# Patient Record
Sex: Male | Born: 1985 | Race: White | Hispanic: No | Smoking: Current every day smoker
Health system: Southern US, Community
[De-identification: ages and names within clinical notes are randomized; demographics above are authoritative.]

## PROBLEM LIST (undated history)

## (undated) DIAGNOSIS — K219 Gastro-esophageal reflux disease without esophagitis: Secondary | ICD-10-CM

---

## 2007-03-18 ENCOUNTER — Emergency Department (HOSPITAL_COMMUNITY): Admission: EM | Admit: 2007-03-18 | Discharge: 2007-03-18 | Payer: Self-pay | Admitting: Emergency Medicine

## 2008-01-15 ENCOUNTER — Emergency Department (HOSPITAL_COMMUNITY): Admission: EM | Admit: 2008-01-15 | Discharge: 2008-01-15 | Payer: Self-pay | Admitting: Emergency Medicine

## 2009-06-28 ENCOUNTER — Emergency Department (HOSPITAL_COMMUNITY): Admission: EM | Admit: 2009-06-28 | Discharge: 2009-06-28 | Payer: Self-pay | Admitting: Emergency Medicine

## 2013-11-12 ENCOUNTER — Emergency Department (HOSPITAL_COMMUNITY)
Admission: EM | Admit: 2013-11-12 | Discharge: 2013-11-12 | Disposition: A | Discharge status: Home / Self Care | Payer: Self-pay | Source: Home / Self Care | Attending: Family Medicine | Admitting: Family Medicine

## 2013-11-12 ENCOUNTER — Encounter (HOSPITAL_COMMUNITY): Payer: Self-pay | Admitting: Emergency Medicine

## 2013-11-12 DIAGNOSIS — Y92410 Unspecified street and highway as the place of occurrence of the external cause: Secondary | ICD-10-CM

## 2013-11-12 DIAGNOSIS — S39012A Strain of muscle, fascia and tendon of lower back, initial encounter: Secondary | ICD-10-CM

## 2013-11-12 DIAGNOSIS — S335XXA Sprain of ligaments of lumbar spine, initial encounter: Secondary | ICD-10-CM

## 2013-11-12 LAB — POCT URINALYSIS DIP (DEVICE)
Bilirubin Urine: NEGATIVE
Glucose, UA: NEGATIVE mg/dL
Hgb urine dipstick: NEGATIVE
KETONES UR: NEGATIVE mg/dL
LEUKOCYTES UA: NEGATIVE
Nitrite: NEGATIVE
PH: 6 (ref 5.0–8.0)
Protein, ur: NEGATIVE mg/dL
Specific Gravity, Urine: 1.02 (ref 1.005–1.030)
UROBILINOGEN UA: 0.2 mg/dL (ref 0.0–1.0)

## 2013-11-12 MED ORDER — IBUPROFEN 800 MG PO TABS
ORAL_TABLET | ORAL | Status: AC
  Filled 2013-11-12: qty 1

## 2013-11-12 MED ORDER — IBUPROFEN 800 MG PO TABS
800.0000 mg | ORAL_TABLET | Freq: Once | ORAL | Status: AC
  Administered 2013-11-12: 800 mg via ORAL

## 2013-11-12 MED ORDER — NAPROXEN 500 MG PO TABS: 500.0000 mg | ORAL_TABLET | Freq: Two times a day (BID) | ORAL | Status: DC
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
## 2013-11-12 NOTE — Discharge Instructions (Signed)
Your urine studies were normal. Please use medication as prescribed for pain and limit heavy lifting for the next 3-4 days. You can expect to be sore for the net 3-5 days. If symptoms become suddenly worse or severe, please return for re-evaluation.   Back Pain, Adult Low back pain is very common. About 1 in 5 people have back pain.The cause of low back pain is rarely dangerous. The pain often gets better over time.About half of people with a sudden onset of back pain feel better in just 2 weeks. About 8 in 10 people feel better by 6 weeks.  CAUSES Some common causes of back pain include:  Strain of the muscles or ligaments supporting the spine.  Wear and tear (degeneration) of the spinal discs.  Arthritis.  Direct injury to the back. DIAGNOSIS Most of the time, the direct cause of low back pain is not known.However, back pain can be treated effectively even when the exact cause of the pain is unknown.Answering your caregiver's questions about your overall health and symptoms is one of the most accurate ways to make sure the cause of your pain is not dangerous. If your caregiver needs more information, he or she may order lab work or imaging tests (X-rays or MRIs).However, even if imaging tests show changes in your back, this usually does not require surgery. HOME CARE INSTRUCTIONS For many people, back pain returns.Since low back pain is rarely dangerous, it is often a condition that people can learn to [facility] their own.   Remain active. It is stressful on the back to sit or stand in one place. Do not sit, drive, or stand in one place for more than 30 minutes at a time. Take short walks on level surfaces as soon as pain allows.Try to increase the length of time you walk each day.  Do not stay in bed.Resting more than 1 or 2 days can delay your recovery.  Do not avoid exercise or work.Your body is made to move.It is not dangerous to be active, even though your back may hurt.Your  back will likely heal faster if you return to being active before your pain is gone.  Pay attention to your body when you bend and lift. Many people have less discomfortwhen lifting if they bend their knees, keep the load close to their bodies,and avoid twisting. Often, the most comfortable positions are those that put less stress on your recovering back.  Find a comfortable position to sleep. Use a firm mattress and lie on your side with your knees slightly bent. If you lie on your back, put a pillow under your knees.  Only take over-the-counter or prescription medicines as directed by your caregiver. Over-the-counter medicines to reduce pain and inflammation are often the most helpful.Your caregiver may prescribe muscle relaxant drugs.These medicines help dull your pain so you can more quickly return to your normal activities and healthy exercise.  Put ice on the injured area.  Put ice in a plastic bag.  Place a towel between your skin and the bag.  Leave the ice on for 15-20 minutes, 03-04 times a day for the first 2 to 3 days. After that, ice and heat may be alternated to reduce pain and spasms.  Ask your caregiver about trying back exercises and gentle massage. This may be of some benefit.  Avoid feeling anxious or stressed.Stress increases muscle tension and can worsen back pain.It is important to recognize when you are anxious or stressed and learn ways to manage  it.Exercise is a great option. SEEK MEDICAL CARE IF:  You have pain that is not relieved with rest or medicine.  You have pain that does not improve in 1 week.  You have new symptoms.  You are generally not feeling well. SEEK IMMEDIATE MEDICAL CARE IF:   You have pain that radiates from your back into your legs.  You develop new bowel or bladder control problems.  You have unusual weakness or numbness in your arms or legs.  You develop nausea or vomiting.  You develop abdominal pain.  You feel  faint. Document Released: 05/30/2005 Document Revised: 11/29/2011 Document Reviewed: 10/18/2010 [facility] Patient Information 2014 [place], [place].  Lumbosacral Strain Lumbosacral strain is a strain of any of the parts that make up your lumbosacral vertebrae. Your lumbosacral vertebrae are the bones that make up the lower third of your backbone. Your lumbosacral vertebrae are held together by muscles and tough, fibrous tissue (ligaments).  CAUSES  A sudden blow to your back can cause lumbosacral strain. Also, anything that causes an excessive stretch of the muscles in the low back can cause this strain. This is typically seen when people exert themselves strenuously, fall, lift heavy objects, bend, or crouch repeatedly. RISK FACTORS  Physically demanding work.  Participation in pushing or pulling sports or sports that require sudden twist of the back (tennis, golf, baseball).  Weight lifting.  Excessive lower back curvature.  Forward-tilted pelvis.  Weak back or abdominal muscles or both.  Tight hamstrings. SIGNS AND SYMPTOMS  Lumbosacral strain may cause pain in the area of your injury or pain that moves (radiates) down your leg.  DIAGNOSIS Your health care provider can often diagnose lumbosacral strain through a physical exam. In some cases, you may need tests such as X-ray exams.  TREATMENT  Treatment for your lower back injury depends on many factors that your clinician will have to evaluate. However, most treatment will include the use of anti-inflammatory medicines. HOME CARE INSTRUCTIONS   Avoid hard physical activities (tennis, racquetball, waterskiing) if you are not in proper physical condition for it. This may aggravate or create problems.  If you have a back problem, avoid sports requiring sudden body movements. Swimming and walking are generally safer activities.  Maintain good posture.  Maintain a healthy weight.  For acute conditions, you may put ice on the  injured area.  Put ice in a plastic bag.  Place a towel between your skin and the bag.  Leave the ice on for 20 minutes, 2 3 times a day.  When the low back starts healing, stretching and strengthening exercises may be recommended. SEEK MEDICAL CARE IF:  Your back pain is getting worse.  You experience severe back pain not relieved with medicines. SEEK IMMEDIATE MEDICAL CARE IF:   You have numbness, tingling, weakness, or problems with the use of your arms or legs.  There is a change in bowel or bladder control.  You have increasing pain in any area of the body, including your belly (abdomen).  You notice shortness of breath, dizziness, or feel faint.  You feel sick to your stomach (nauseous), are throwing up (vomiting), or become sweaty.  You notice discoloration of your toes or legs, or your feet get very cold. MAKE SURE YOU:   Understand these instructions.  Will watch your condition.  Will get help right away if you are not doing well or get worse. Document Released: 03/09/2005 Document Revised: 03/20/2013 Document Reviewed: 01/16/2013 [facility] Patient Information 2014 [place], [place].  Motor Vehicle Collision  It is common to have multiple bruises and sore muscles after a motor vehicle collision (MVC). These tend to feel worse for the first 24 hours. You may have the most stiffness and soreness over the first several hours. You may also feel worse when you wake up the first morning after your collision. After this point, you will usually begin to improve with each day. The speed of improvement often depends on the severity of the collision, the number of injuries, and the location and nature of these injuries. HOME CARE INSTRUCTIONS   Put ice on the injured area.  Put ice in a plastic bag.  Place a towel between your skin and the bag.  Leave the ice on for 15-20 minutes, 03-04 times a day.  Drink enough fluids to keep your urine clear or pale yellow. Do not drink  alcohol.  Take a warm shower or bath once or twice a day. This will increase blood flow to sore muscles.  You may return to activities as directed by your caregiver. Be careful when lifting, as this may aggravate neck or back pain.  Only take over-the-counter or prescription medicines for pain, discomfort, or fever as directed by your caregiver. Do not use aspirin. This may increase bruising and bleeding. SEEK IMMEDIATE MEDICAL CARE IF:  You have numbness, tingling, or weakness in the arms or legs.  You develop severe headaches not relieved with medicine.  You have severe neck pain, especially tenderness in the middle of the back of your neck.  You have changes in bowel or bladder control.  There is increasing pain in any area of the body.  You have shortness of breath, lightheadedness, dizziness, or fainting.  You have chest pain.  You feel sick to your stomach (nauseous), throw up (vomit), or sweat.  You have increasing abdominal discomfort.  There is blood in your urine, stool, or vomit.  You have pain in your shoulder (shoulder strap areas).  You feel your symptoms are getting worse. MAKE SURE YOU:   Understand these instructions.  Will watch your condition.  Will get help right away if you are not doing well or get worse. Document Released: 05/30/2005 Document Revised: 08/22/2011 Document Reviewed: 10/27/2010 [facility] Patient Information 2014 [place], [place].  Muscle Strain A muscle strain is an injury that occurs when a muscle is stretched beyond its normal length. Usually a small number of muscle fibers are torn when this happens. Muscle strain is rated in degrees. First-degree strains have the least amount of muscle fiber tearing and pain. Second-degree and third-degree strains have increasingly more tearing and pain.  Usually, recovery from muscle strain takes 1 2 weeks. Complete healing takes 5 6 weeks.  CAUSES  Muscle strain happens when a sudden, violent  force placed on a muscle stretches it too far. This may occur with lifting, sports, or a fall.  RISK FACTORS Muscle strain is especially common in athletes.  SIGNS AND SYMPTOMS At the site of the muscle strain, there may be:  Pain.  Bruising.  Swelling.  Difficulty using the muscle due to pain or lack of normal function. DIAGNOSIS  Your health care provider will perform a physical exam and ask about your medical history. TREATMENT  Often, the best treatment for a muscle strain is resting, icing, and applying cold compresses to the injured area.  HOME CARE INSTRUCTIONS   Use the PRICE method of treatment to promote muscle healing during the first 2 3 days after your injury.  The PRICE method involves:  Protecting the muscle from being injured again.  Restricting your activity and resting the injured body part.  Icing your injury. To do this, put ice in a plastic bag. Place a towel between your skin and the bag. Then, apply the ice and leave it on from 15 20 minutes each hour. After the third day, switch to moist heat packs.  Apply compression to the injured area with a splint or elastic bandage. Be careful not to wrap it too tightly. This may interfere with blood circulation or increase swelling.  Elevate the injured body part above the level of your heart as often as you can.  Only take over-the-counter or prescription medicines for pain, discomfort, or fever as directed by your health care provider.  Warming up prior to exercise helps to prevent future muscle strains. SEEK MEDICAL CARE IF:   You have increasing pain or swelling in the injured area.  You have numbness, tingling, or a significant loss of strength in the injured area. MAKE SURE YOU:   Understand these instructions.  Will watch your condition.  Will get help right away if you are not doing well or get worse. Document Released: 05/30/2005 Document Revised: 03/20/2013 Document Reviewed: 12/27/2012 [facility]  Patient Information 2014 [place], [place].

## 2013-11-12 NOTE — ED Provider Notes (Signed)
Medical screening examination/treatment/procedure(s) were performed by resident physician or non-physician practitioner and as supervising physician I was immediately available for consultation/collaboration.   Barkley Bruns MD.   Linna Hoff, MD 11/12/13 1600
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
## 2014-07-26 ENCOUNTER — Emergency Department (HOSPITAL_COMMUNITY)
Admission: EM | Admit: 2014-07-26 | Discharge: 2014-07-27 | Disposition: A | Discharge status: Home / Self Care | Payer: Self-pay | Attending: Emergency Medicine | Admitting: Emergency Medicine

## 2014-07-26 ENCOUNTER — Encounter (HOSPITAL_COMMUNITY): Payer: Self-pay | Admitting: Emergency Medicine

## 2014-07-26 DIAGNOSIS — Z720 Tobacco use: Secondary | ICD-10-CM | POA: Insufficient documentation

## 2014-07-26 DIAGNOSIS — R51 Headache: Secondary | ICD-10-CM | POA: Insufficient documentation

## 2014-07-26 DIAGNOSIS — M791 Myalgia: Secondary | ICD-10-CM | POA: Insufficient documentation

## 2014-07-26 DIAGNOSIS — J029 Acute pharyngitis, unspecified: Secondary | ICD-10-CM | POA: Insufficient documentation

## 2014-07-26 DIAGNOSIS — R519 Headache, unspecified: Secondary | ICD-10-CM

## 2014-07-26 NOTE — ED Notes (Signed)
Pt. reports persistent headache onset last week unrelieved by OTC pain medications , denies head injury , no emesis or fever .

## 2014-07-26 NOTE — ED Provider Notes (Addendum)
CSN: [id number]     Arrival date & time 07/26/14  2148 History   First MD Initiated Contact with Patient 07/26/14 2352     Chief Complaint  Patient presents with  . Headache   Patient is a 29 y.o. male presenting with headaches. The history is provided by the patient. No language interpreter was used.  Headache Associated symptoms: myalgias and sore throat   Associated symptoms: no fever, no nausea, no photophobia and no vomiting    This chart was scribed for Lyanne CoKevin M Sweta Halseth, MD by Andrew Auaven Small, ED Scribe. This patient was seen in room A13C/A13C and the patient's care was started at 12:01 AM.  HPI Comments:  Pablo LedgerKerry R Sublette is a 29 y.o. male who present to the Emergency Department complaining of a HA onset 6 days. Pt states HA initally only occurred during the night but began to persist during the day 2 days ago. He states pain radiates to shoulders and has a slight sore throat. Pt has taken advil anf ibuprofen without relief.  He denies hx of migraines and cancer. Pt denies injury or trauma. He denies phonophobia, photophobia, nausea, emesis, weight change. Pt works second shift as an Personnel officerelectrician. He reports he is heathy otherwise.   History reviewed. No pertinent past medical history. History reviewed. No pertinent past surgical history. No family history on file. History  Substance Use Topics  . Smoking status: Current Every Day Smoker -- 0.50 packs/day    Types: Cigarettes  . Smokeless tobacco: Not on file  . Alcohol Use: No    Review of Systems  Constitutional: Negative for fever, chills and unexpected weight change.  HENT: Positive for sore throat.   Eyes: Negative for photophobia and visual disturbance.  Gastrointestinal: Negative for nausea and vomiting.  Musculoskeletal: Positive for myalgias.  Neurological: Positive for headaches.  All other systems reviewed and are negative.  Allergies  Review of patient's allergies indicates no known allergies.  Home Medications    Prior to Admission medications   Medication Sig Start Date End Date Taking? Authorizing Provider  aspirin-acetaminophen-caffeine (EXCEDRIN MIGRAINE) [phone] MG per tablet Take 1-2 tablets by mouth every 6 (six) hours as needed for headache.   Yes Historical Provider, MD  ibuprofen (ADVIL,MOTRIN) 200 MG tablet Take 400 mg by mouth every 4 (four) hours as needed for headache.   Yes Historical Provider, MD  naproxen sodium (ANAPROX) 220 MG tablet Take 440 mg by mouth 2 (two) times daily as needed (headache).   Yes Historical Provider, MD  naproxen (NAPROSYN) 500 MG tablet Take 1 tablet (500 mg total) by mouth 2 (two) times daily. Patient not taking: Reported on 07/26/2014 11/12/13   Jess BartersJennifer Lee H Presson, PA   BP 140/99 mmHg  Pulse 78  Temp(Src) 97.8 F (36.6 C) (Oral)  Resp 15  Ht 5\' 4"  (1.626 m)  Wt 115 lb (52.164 kg)  BMI 19.73 kg/m2  SpO2 100% Physical Exam  Constitutional: He is oriented to person, place, and time. He appears well-developed and well-nourished. No distress.  HENT:  Head: Normocephalic and atraumatic.  Right Ear: Tympanic membrane normal.  Left Ear: Tympanic membrane normal.  Nose: Nose normal.  Mouth/Throat: Uvula is midline and mucous membranes are normal. Oropharyngeal exudate ( mild) and posterior oropharyngeal erythema (mild) present.  prominent tonsils bilaterally.  Eyes: Conjunctivae and EOM are normal.  Neck: Normal range of motion. Neck supple.  Cardiovascular: Normal rate and regular rhythm.   Pulmonary/Chest: Effort normal. He has no wheezes. He has no  rales.  Abdominal: Soft. Bowel sounds are normal. He exhibits no mass. There is no tenderness.  Musculoskeletal: Normal range of motion. He exhibits no edema.  No cervical spine tenderness.   Neurological: He is alert and oriented to person, place, and time. He has normal strength. GCS eye subscore is 4. GCS verbal subscore is 5. GCS motor subscore is 6.  Skin: Skin is warm and dry.  Psychiatric: He  has a normal mood and affect. His behavior is normal.  Nursing note and vitals reviewed.   ED Course  Procedures (including critical care time) DIAGNOSTIC STUDIES: Oxygen Saturation is 100% on RA, normal by my interpretation.    COORDINATION OF CARE: 12:09 AM- Pt advised of plan for treatment and pt agrees.  1:25 AM- pt reports symptoms have improved  Labs Review Labs Reviewed - No data to display  Imaging Review Ct Head Wo Contrast  07/27/2014   CLINICAL DATA:  Acute onset of severe occipital headache for 2 days. Initial encounter.  EXAM: CT HEAD WITHOUT CONTRAST  TECHNIQUE: Contiguous axial images were obtained from the base of the skull through the vertex without intravenous contrast.  COMPARISON:  None.  FINDINGS: There is no evidence of acute infarction, mass lesion, or intra- or extra-axial hemorrhage on CT.  The posterior fossa, including the cerebellum, brainstem and fourth ventricle, is within normal limits. The third and lateral ventricles, and basal ganglia are unremarkable in appearance. The cerebral hemispheres are symmetric in appearance, with normal gray-white differentiation. No mass effect or midline shift is seen.  There is no evidence of fracture; visualized osseous structures are unremarkable in appearance. A bone island is noted at the vertex. The orbits are within normal limits. The paranasal sinuses and mastoid air cells are well-aerated. No significant soft tissue abnormalities are seen.  IMPRESSION: Unremarkable noncontrast CT of the head.   Electronically Signed   By: Roanna Raider M.D.   On: 07/27/2014 00:48     EKG Interpretation None      MDM   Final diagnoses:  None    Atypical headache.  Not classic for migraine.  Head CT negative.  Patient feeling better after pain medication emergency department.  Discharge home with neurology follow-up.  No neck pain or stiffness.  No fevers.  Doubt meningitis.  Overall well-appearing.  Vision normal.  I  personally performed the services described in this documentation, which was scribed in my presence. The recorded information has been reviewed and is accurate.    Lyanne Co, MD 07/27/14 4098  Lyanne Co, MD 07/27/14 [phone]

## 2014-07-27 ENCOUNTER — Emergency Department (HOSPITAL_COMMUNITY): Payer: Self-pay

## 2014-07-27 MED ORDER — METOCLOPRAMIDE HCL 5 MG/ML IJ SOLN
10.0000 mg | Freq: Once | INTRAMUSCULAR | Status: AC
  Administered 2014-07-27: 10 mg via INTRAVENOUS
  Filled 2014-07-27: qty 2

## 2014-07-27 MED ORDER — KETOROLAC TROMETHAMINE 30 MG/ML IJ SOLN
30.0000 mg | Freq: Once | INTRAMUSCULAR | Status: AC
  Administered 2014-07-27: 30 mg via INTRAVENOUS
  Filled 2014-07-27: qty 1

## 2014-07-27 MED ORDER — MORPHINE SULFATE 4 MG/ML IJ SOLN
4.0000 mg | Freq: Once | INTRAMUSCULAR | Status: AC
  Administered 2014-07-27: 4 mg via INTRAVENOUS
  Filled 2014-07-27: qty 1

## 2014-07-27 MED ORDER — HYDROCODONE-ACETAMINOPHEN 5-325 MG PO TABS: 1.0000 | ORAL_TABLET | ORAL | Status: DC | PRN

## 2014-07-27 MED ORDER — NAPROXEN 500 MG PO TABS: 500.0000 mg | ORAL_TABLET | Freq: Two times a day (BID) | ORAL | Status: DC

## 2014-07-27 NOTE — Discharge Instructions (Signed)

## 2015-11-10 ENCOUNTER — Emergency Department (HOSPITAL_COMMUNITY): Payer: Self-pay | Admitting: Anesthesiology

## 2015-11-10 ENCOUNTER — Observation Stay (HOSPITAL_COMMUNITY)
Admission: EM | Admit: 2015-11-10 | Discharge: 2015-11-11 | Disposition: A | Discharge status: Home / Self Care | Payer: Self-pay | Attending: General Surgery | Admitting: General Surgery

## 2015-11-10 ENCOUNTER — Encounter (HOSPITAL_COMMUNITY): Payer: Self-pay | Admitting: Emergency Medicine

## 2015-11-10 ENCOUNTER — Emergency Department (HOSPITAL_COMMUNITY): Payer: Self-pay

## 2015-11-10 ENCOUNTER — Encounter (HOSPITAL_COMMUNITY)
Admission: EM | Disposition: A | Discharge status: Home / Self Care | Payer: Self-pay | Source: Home / Self Care | Attending: Emergency Medicine

## 2015-11-10 DIAGNOSIS — Z9049 Acquired absence of other specified parts of digestive tract: Secondary | ICD-10-CM

## 2015-11-10 DIAGNOSIS — K3580 Unspecified acute appendicitis: Principal | ICD-10-CM | POA: Insufficient documentation

## 2015-11-10 DIAGNOSIS — K3589 Other acute appendicitis without perforation or gangrene: Secondary | ICD-10-CM

## 2015-11-10 DIAGNOSIS — Z791 Long term (current) use of non-steroidal anti-inflammatories (NSAID): Secondary | ICD-10-CM | POA: Insufficient documentation

## 2015-11-10 DIAGNOSIS — F17210 Nicotine dependence, cigarettes, uncomplicated: Secondary | ICD-10-CM | POA: Insufficient documentation

## 2015-11-10 HISTORY — DX: Gastro-esophageal reflux disease without esophagitis: K21.9

## 2015-11-10 HISTORY — PX: LAPAROSCOPIC APPENDECTOMY: SHX408

## 2015-11-10 HISTORY — PX: APPENDECTOMY: SHX54

## 2015-11-10 LAB — URINALYSIS, ROUTINE W REFLEX MICROSCOPIC
GLUCOSE, UA: NEGATIVE mg/dL
Hgb urine dipstick: NEGATIVE
KETONES UR: 15 mg/dL — AB
LEUKOCYTES UA: NEGATIVE
Nitrite: NEGATIVE
PROTEIN: NEGATIVE mg/dL
Specific Gravity, Urine: 1.026 (ref 1.005–1.030)
pH: 5.5 (ref 5.0–8.0)

## 2015-11-10 LAB — COMPREHENSIVE METABOLIC PANEL
ALK PHOS: 48 U/L (ref 38–126)
ALT: 7 U/L — ABNORMAL LOW (ref 17–63)
ANION GAP: 7 (ref 5–15)
AST: 13 U/L — AB (ref 15–41)
Albumin: 4 g/dL (ref 3.5–5.0)
BILIRUBIN TOTAL: 0.8 mg/dL (ref 0.3–1.2)
BUN: 10 mg/dL (ref 6–20)
CALCIUM: 9.3 mg/dL (ref 8.9–10.3)
CO2: 27 mmol/L (ref 22–32)
Chloride: 106 mmol/L (ref 101–111)
Creatinine, Ser: 0.98 mg/dL (ref 0.61–1.24)
GFR calc Af Amer: >60 mL/min (ref >60)
Glucose, Bld: 86 mg/dL (ref 65–99)
POTASSIUM: 4.1 mmol/L (ref 3.5–5.1)
Sodium: 140 mmol/L (ref 135–145)
TOTAL PROTEIN: 7 g/dL (ref 6.5–8.1)

## 2015-11-10 LAB — CBC WITH DIFFERENTIAL/PLATELET
Basophils Absolute: 0 10*3/uL (ref 0.0–0.1)
Basophils Relative: 0 %
Eosinophils Absolute: 0.2 10*3/uL (ref 0.0–0.7)
Eosinophils Relative: 1 %
HEMATOCRIT: 45.1 % (ref 39.0–52.0)
Hemoglobin: 14.9 g/dL (ref 13.0–17.0)
LYMPHS PCT: 13 %
Lymphs Abs: 1.8 10*3/uL (ref 0.7–4.0)
MCH: 29.8 pg (ref 26.0–34.0)
MCHC: 33 g/dL (ref 30.0–36.0)
MCV: 90.2 fL (ref 78.0–100.0)
MONO ABS: 1.5 10*3/uL — AB (ref 0.1–1.0)
MONOS PCT: 11 %
NEUTROS ABS: 10.3 10*3/uL — AB (ref 1.7–7.7)
Neutrophils Relative %: 75 %
Platelets: 300 10*3/uL (ref 150–400)
RBC: 5 MIL/uL (ref 4.22–5.81)
RDW: 12.7 % (ref 11.5–15.5)
WBC: 13.8 10*3/uL — ABNORMAL HIGH (ref 4.0–10.5)

## 2015-11-10 LAB — LIPASE, BLOOD: LIPASE: 22 U/L (ref 11–51)

## 2015-11-10 SURGERY — APPENDECTOMY, LAPAROSCOPIC
Anesthesia: General | Site: Abdomen

## 2015-11-10 MED ORDER — BUPIVACAINE HCL (PF) 0.25 % IJ SOLN
INTRAMUSCULAR | Status: AC
  Filled 2015-11-10: qty 30

## 2015-11-10 MED ORDER — ONDANSETRON HCL 4 MG/2ML IJ SOLN
4.0000 mg | Freq: Once | INTRAMUSCULAR | Status: AC
  Administered 2015-11-10: 4 mg via INTRAVENOUS
  Filled 2015-11-10: qty 2

## 2015-11-10 MED ORDER — MEPERIDINE HCL 25 MG/ML IJ SOLN
INTRAMUSCULAR | Status: AC
  Filled 2015-11-10: qty 1

## 2015-11-10 MED ORDER — SODIUM CHLORIDE 0.9 % IV SOLN
INTRAVENOUS | Status: DC
  Administered 2015-11-10: 22:00:00 via INTRAVENOUS

## 2015-11-10 MED ORDER — CIPROFLOXACIN IN D5W 400 MG/200ML IV SOLN
400.0000 mg | Freq: Two times a day (BID) | INTRAVENOUS | Status: AC
  Administered 2015-11-10: 400 mg via INTRAVENOUS
  Filled 2015-11-10: qty 200

## 2015-11-10 MED ORDER — BUPIVACAINE HCL 0.25 % IJ SOLN
INTRAMUSCULAR | Status: DC | PRN
  Administered 2015-11-10: 4 mL

## 2015-11-10 MED ORDER — ONDANSETRON HCL 4 MG/2ML IJ SOLN
INTRAMUSCULAR | Status: AC
  Filled 2015-11-10: qty 2

## 2015-11-10 MED ORDER — SODIUM CHLORIDE 0.9 % IR SOLN
Status: DC | PRN
Start: 2015-11-10 — End: 2015-11-10
  Administered 2015-11-10: 1000 mL

## 2015-11-10 MED ORDER — KETOROLAC TROMETHAMINE 30 MG/ML IJ SOLN
30.0000 mg | Freq: Once | INTRAMUSCULAR | Status: AC | PRN
  Administered 2015-11-10: 30 mg via INTRAVENOUS

## 2015-11-10 MED ORDER — MIDAZOLAM HCL 5 MG/5ML IJ SOLN
INTRAMUSCULAR | Status: DC | PRN
  Administered 2015-11-10: 2 mg via INTRAVENOUS

## 2015-11-10 MED ORDER — KETOROLAC TROMETHAMINE 30 MG/ML IJ SOLN
INTRAMUSCULAR | Status: AC
  Filled 2015-11-10: qty 1

## 2015-11-10 MED ORDER — HYDROMORPHONE HCL 1 MG/ML IJ SOLN
1.0000 mg | Freq: Once | INTRAMUSCULAR | Status: AC
  Administered 2015-11-10: 1 mg via INTRAVENOUS
  Filled 2015-11-10: qty 1

## 2015-11-10 MED ORDER — PROPOFOL 10 MG/ML IV BOLUS
INTRAVENOUS | Status: DC | PRN
  Administered 2015-11-10: 130 mg via INTRAVENOUS

## 2015-11-10 MED ORDER — ROCURONIUM BROMIDE 50 MG/5ML IV SOLN
INTRAVENOUS | Status: AC
  Filled 2015-11-10: qty 1

## 2015-11-10 MED ORDER — HYDROMORPHONE HCL 1 MG/ML IJ SOLN
INTRAMUSCULAR | Status: AC
  Filled 2015-11-10: qty 1

## 2015-11-10 MED ORDER — MEPERIDINE HCL 25 MG/ML IJ SOLN
6.2500 mg | INTRAMUSCULAR | Status: DC | PRN
  Administered 2015-11-10: 6.25 mg via INTRAVENOUS

## 2015-11-10 MED ORDER — METRONIDAZOLE IN NACL 5-0.79 MG/ML-% IV SOLN
500.0000 mg | Freq: Three times a day (TID) | INTRAVENOUS | Status: AC
  Administered 2015-11-11: 500 mg via INTRAVENOUS
  Filled 2015-11-10: qty 100

## 2015-11-10 MED ORDER — DEXTROSE 5 % IV SOLN
1.0000 g | Freq: Once | INTRAVENOUS | Status: AC
  Administered 2015-11-10: 1 g via INTRAVENOUS
  Filled 2015-11-10: qty 10

## 2015-11-10 MED ORDER — FENTANYL CITRATE (PF) 250 MCG/5ML IJ SOLN
INTRAMUSCULAR | Status: AC
  Filled 2015-11-10: qty 5

## 2015-11-10 MED ORDER — ROCURONIUM BROMIDE 100 MG/10ML IV SOLN
INTRAVENOUS | Status: DC | PRN
  Administered 2015-11-10: 30 mg via INTRAVENOUS

## 2015-11-10 MED ORDER — PROPOFOL 10 MG/ML IV BOLUS
INTRAVENOUS | Status: AC
  Filled 2015-11-10: qty 20

## 2015-11-10 MED ORDER — ONDANSETRON 4 MG PO TBDP: 4.0000 mg | ORAL_TABLET | Freq: Four times a day (QID) | ORAL | Status: DC | PRN

## 2015-11-10 MED ORDER — FENTANYL CITRATE (PF) 250 MCG/5ML IJ SOLN
INTRAMUSCULAR | Status: DC | PRN
  Administered 2015-11-10: 150 ug via INTRAVENOUS
  Administered 2015-11-10: 100 ug via INTRAVENOUS

## 2015-11-10 MED ORDER — HYDROMORPHONE HCL 1 MG/ML IJ SOLN
1.0000 mg | INTRAMUSCULAR | Status: DC | PRN
  Administered 2015-11-10: 1 mg via INTRAVENOUS
  Filled 2015-11-10: qty 1

## 2015-11-10 MED ORDER — MIDAZOLAM HCL 2 MG/2ML IJ SOLN
INTRAMUSCULAR | Status: AC
  Filled 2015-11-10: qty 2

## 2015-11-10 MED ORDER — ONDANSETRON HCL 4 MG/2ML IJ SOLN: 4.0000 mg | Freq: Four times a day (QID) | INTRAMUSCULAR | Status: DC | PRN

## 2015-11-10 MED ORDER — PROCHLORPERAZINE EDISYLATE 5 MG/ML IJ SOLN: 10.0000 mg | Freq: Once | INTRAMUSCULAR | Status: DC | PRN

## 2015-11-10 MED ORDER — 0.9 % SODIUM CHLORIDE (POUR BTL) OPTIME
TOPICAL | Status: DC | PRN
Start: 2015-11-10 — End: 2015-11-10
  Administered 2015-11-10: 1000 mL

## 2015-11-10 MED ORDER — SUGAMMADEX SODIUM 200 MG/2ML IV SOLN
INTRAVENOUS | Status: DC | PRN
  Administered 2015-11-10: 200 mg via INTRAVENOUS

## 2015-11-10 MED ORDER — IOPAMIDOL (ISOVUE-300) INJECTION 61%
INTRAVENOUS | Status: AC
  Administered 2015-11-10: 100 mL
  Filled 2015-11-10: qty 100

## 2015-11-10 MED ORDER — ONDANSETRON HCL 4 MG/2ML IJ SOLN
INTRAMUSCULAR | Status: DC | PRN
  Administered 2015-11-10: 4 mg via INTRAVENOUS

## 2015-11-10 MED ORDER — METRONIDAZOLE IN NACL 5-0.79 MG/ML-% IV SOLN
500.0000 mg | Freq: Once | INTRAVENOUS | Status: AC
  Administered 2015-11-10: 500 mg via INTRAVENOUS
  Filled 2015-11-10: qty 100

## 2015-11-10 MED ORDER — SUGAMMADEX SODIUM 200 MG/2ML IV SOLN
INTRAVENOUS | Status: AC
  Filled 2015-11-10: qty 2

## 2015-11-10 MED ORDER — LIDOCAINE HCL (CARDIAC) 20 MG/ML IV SOLN
INTRAVENOUS | Status: DC | PRN
  Administered 2015-11-10: 60 mg via INTRAVENOUS

## 2015-11-10 MED ORDER — HYDROMORPHONE HCL 1 MG/ML IJ SOLN
0.2500 mg | INTRAMUSCULAR | Status: DC | PRN
  Administered 2015-11-10 (×3): 0.5 mg via INTRAVENOUS

## 2015-11-10 MED ORDER — LACTATED RINGERS IV SOLN
INTRAVENOUS | Status: DC | PRN
  Administered 2015-11-10 (×2): via INTRAVENOUS

## 2015-11-10 MED ORDER — DIATRIZOATE MEGLUMINE & SODIUM 66-10 % PO SOLN
ORAL | Status: AC
Start: 2015-11-10 — End: 2015-11-11
  Filled 2015-11-10: qty 30

## 2015-11-10 MED ORDER — HYDROCODONE-ACETAMINOPHEN 5-325 MG PO TABS
1.0000 | ORAL_TABLET | ORAL | Status: DC | PRN
  Administered 2015-11-10 – 2015-11-11 (×3): 2 via ORAL
  Filled 2015-11-10 (×3): qty 2

## 2015-11-10 SURGICAL SUPPLY — 41 items
APPLIER CLIP 5 13 M/L LIGAMAX5 (MISCELLANEOUS)
BENZOIN TINCTURE PRP APPL 2/3 (GAUZE/BANDAGES/DRESSINGS) ×3 IMPLANT
BLADE SURG ROTATE 9660 (MISCELLANEOUS) IMPLANT
CANISTER SUCTION 2500CC (MISCELLANEOUS) ×3 IMPLANT
CHLORAPREP W/TINT 26ML (MISCELLANEOUS) ×3 IMPLANT
CLIP APPLIE 5 13 M/L LIGAMAX5 (MISCELLANEOUS) IMPLANT
CLOSURE WOUND 1/2 X4 (GAUZE/BANDAGES/DRESSINGS) ×1
COVER SURGICAL LIGHT HANDLE (MISCELLANEOUS) ×3 IMPLANT
COVER TRANSDUCER ULTRASND (DRAPES) ×3 IMPLANT
DEVICE TROCAR PUNCTURE CLOSURE (ENDOMECHANICALS) ×3 IMPLANT
ELECT REM PT RETURN 9FT ADLT (ELECTROSURGICAL) ×3
ELECTRODE REM PT RTRN 9FT ADLT (ELECTROSURGICAL) ×1 IMPLANT
ENDOLOOP SUT PDS II  0 18 (SUTURE) ×6
ENDOLOOP SUT PDS II 0 18 (SUTURE) ×3 IMPLANT
GAUZE SPONGE 2X2 8PLY STRL LF (GAUZE/BANDAGES/DRESSINGS) ×1 IMPLANT
GLOVE BIO SURGEON STRL SZ7.5 (GLOVE) ×3 IMPLANT
GOWN STRL REUS W/ TWL LRG LVL3 (GOWN DISPOSABLE) ×2 IMPLANT
GOWN STRL REUS W/ TWL XL LVL3 (GOWN DISPOSABLE) ×2 IMPLANT
GOWN STRL REUS W/TWL LRG LVL3 (GOWN DISPOSABLE) ×4
GOWN STRL REUS W/TWL XL LVL3 (GOWN DISPOSABLE) ×4
KIT BASIN OR (CUSTOM PROCEDURE TRAY) ×3 IMPLANT
KIT ROOM TURNOVER OR (KITS) ×3 IMPLANT
NEEDLE INSUFFLATION 14GA 120MM (NEEDLE) ×3 IMPLANT
NS IRRIG 1000ML POUR BTL (IV SOLUTION) ×3 IMPLANT
PAD ARMBOARD 7.5X6 YLW CONV (MISCELLANEOUS) ×6 IMPLANT
SCISSORS LAP 5X35 DISP (ENDOMECHANICALS) ×3 IMPLANT
SET IRRIG TUBING LAPAROSCOPIC (IRRIGATION / IRRIGATOR) ×3 IMPLANT
SLEEVE ENDOPATH XCEL 5M (ENDOMECHANICALS) ×3 IMPLANT
SPECIMEN JAR SMALL (MISCELLANEOUS) ×3 IMPLANT
SPONGE GAUZE 2X2 STER 10/PKG (GAUZE/BANDAGES/DRESSINGS) ×2
STRIP CLOSURE SKIN 1/2X4 (GAUZE/BANDAGES/DRESSINGS) ×2 IMPLANT
SUT MNCRL AB 3-0 PS2 18 (SUTURE) ×3 IMPLANT
SUT SILK 2 0 SH (SUTURE) IMPLANT
TAPE CLOTH SURG 4X10 WHT LF (GAUZE/BANDAGES/DRESSINGS) ×3 IMPLANT
TOWEL OR 17X24 6PK STRL BLUE (TOWEL DISPOSABLE) ×3 IMPLANT
TOWEL OR 17X26 10 PK STRL BLUE (TOWEL DISPOSABLE) ×3 IMPLANT
TRAY FOLEY CATH 16FR SILVER (SET/KITS/TRAYS/PACK) ×3 IMPLANT
TRAY LAPAROSCOPIC MC (CUSTOM PROCEDURE TRAY) ×3 IMPLANT
TROCAR XCEL NON-BLD 11X100MML (ENDOMECHANICALS) ×3 IMPLANT
TROCAR XCEL NON-BLD 5MMX100MML (ENDOMECHANICALS) ×3 IMPLANT
TUBING INSUFFLATION (TUBING) ×3 IMPLANT

## 2015-11-10 NOTE — Anesthesia Procedure Notes (Signed)
Procedure Name: Intubation Date/Time: 11/10/2015 6:39 PM Performed by: Arlice ColtMANESS, Payne Garske B Pre-anesthesia Checklist: Patient identified, Emergency Drugs available, Suction available, Patient being monitored and Timeout performed Patient Re-evaluated:Patient Re-evaluated prior to inductionOxygen Delivery Method: Circle system utilized Preoxygenation: Pre-oxygenation with 100% oxygen Intubation Type: IV induction Ventilation: Mask ventilation without difficulty Laryngoscope Size: Mac and 3 Grade View: Grade I Tube type: Oral Tube size: 7.5 mm Number of attempts: 1 Airway Equipment and Method: Stylet Placement Confirmation: ETT inserted through vocal cords under direct vision,  positive ETCO2 and breath sounds checked- equal and bilateral Secured at: 22 cm Tube secured with: Tape Dental Injury: Teeth and Oropharynx as per pre-operative assessment

## 2015-11-10 NOTE — ED Notes (Signed)
Pt from home, presents with c/o sharp RLQ pain that began Saturday. Abd tender to touch in RLQ. Per pt, pain so bad he was unable to walk last night. Pain is now 10/10. Denies n/v/d, last BM yesterday.

## 2015-11-10 NOTE — Op Note (Signed)
11/10/2015  7:17 PM  PATIENT:  William Simon  30 y.o. male  PRE-OPERATIVE DIAGNOSIS:  appendicitis  POST-OPERATIVE DIAGNOSIS:  Emergent Acute , non perforated, appendicitis  PROCEDURE:  Procedure(s): APPENDECTOMY LAPAROSCOPIC (N/A)  SURGEON:  Surgeon(s) and Role:    * Axel FillerArmando Mohamed Portlock, MD - Primary  ANESTHESIA:   local and general  EBL:  Total I/O In: 1000 [I.V.:1000] Out: 10 [Blood:10]  BLOOD ADMINISTERED:none  DRAINS: none   LOCAL MEDICATIONS USED:  BUPIVICAINE   SPECIMEN:  Source of Specimen:  appendix  DISPOSITION OF SPECIMEN:  PATHOLOGY  COUNTS:  YES  TOURNIQUET:  * No tourniquets in log *  DICTATION: .Dragon Dictation Complications: none  Counts: reported as correct x 2  Findings:  The patient had a acutely inflamed non perforated appendix  Specimen: Appendix  Indications for procedure:  The patient is a 30 year old male with a history of periumbilical pain localized in the right lower quadrant patient had a CT scan which revealed signs consistent with acute appendicitis the patient back in for laparoscopic appendectomy.  Details of the procedure:The patient was taken back to the operating room. The patient was placed in supine position with bilateral SCDs in place.  A foley catheter was place. The patient was prepped and draped in the usual sterile fashion.  After appropriate anitbiotics were confirmed, a time-out was confirmed and all facts were verified.    A pneumoperitoneum of 14 mmHg was obtained via a Veress needle technique in the left lower quadrant quadrant.  A 5 mm trocar and 5 mm camera then placed intra-abdominally there is no injury to any intra-abdominal organs a 10 mm infraumbilical port was placed and direct visualization as was a 5 mm port in the suprapubic area.   The appendix was identified and seen to be non-perforated, but acutely inflamed.  The appendix was cleaned down to the appendiceal base. The mesoappendix was then incised and the  appendiceal artery was cauterized.  The the appendiceal base was clean.  At this time an Endoloop was placed proximallyx2 and one distally and the appendix was transected between these 2. A retrieval bag was then placed into the abdomen and the specimen placed in the bag. The appendiceal stump was cauterized. We evacuate the fluid from the pelvis until the effluent was clear.  The appendix and retrieval  bag was then retrieved via the supraumbilical port. #1 Vicryl was used to reapproximate the fascia at the umbilical port site x1. The skin was reapproximated all port sites 3-0 Monocryl subcuticular fashion. The skin was dressed with steri-strips, guaze, and tape.  The patient had the foley removed. The patient was awakened from general anesthesia was taken to recovery room in stable condition.      PLAN OF CARE: Admit for overnight observation  PATIENT DISPOSITION:  PACU - hemodynamically stable.   Delay start of Pharmacological VTE agent (>24hrs) due to surgical blood loss or risk of bleeding: not applicable

## 2015-11-10 NOTE — Anesthesia Postprocedure Evaluation (Signed)
Anesthesia Post Note  Patient: Pablo LedgerKerry R Manwarren  Procedure(s) Performed: Procedure(s) (LRB): APPENDECTOMY LAPAROSCOPIC (N/A)  Patient location during evaluation: PACU Anesthesia Type: General Level of consciousness: awake and alert Pain management: pain level controlled Vital Signs Assessment: post-procedure vital signs reviewed and stable Respiratory status: spontaneous breathing, nonlabored ventilation and respiratory function stable Cardiovascular status: blood pressure returned to baseline and stable Postop Assessment: no signs of nausea or vomiting Anesthetic complications: no    Last Vitals:  Filed Vitals:   11/10/15 2015 11/10/15 2024  BP: 125/86   Pulse: 86 79  Temp:  36.7 C  Resp: 15 15    Last Pain:  Filed Vitals:   11/10/15 2028  PainSc: 4                  Nataniel Gasper,W. EDMOND

## 2015-11-10 NOTE — ED Provider Notes (Signed)
CSN: [id number]     Arrival date & time 11/10/15  1020 History   First MD Initiated Contact with Patient 11/10/15 1035     Chief Complaint  Patient presents with  . Abdominal Pain     (Consider location/radiation/quality/duration/timing/severity/associated sxs/prior Treatment) HPI   This is a 30 year old male who presents emergency Department with chief complaint of right lower quadrant abdominal pain. Patient states that it began about 3 days ago. He has been constant since that time and progressively worsening. It is somewhat colicky in nature and did radiate to his testicle earlier today. Had some associated nausea without vomiting, he denies urinary symptoms or history of kidney stones. He has no history of previous abdominal surgeries. Last bowel movement was yesterday morning and normal. He rates his pain as severe at this time. She denies any genital sxs. Denies fevers, chills, myalgias, arthralgias. Denies DOE, SOB, chest tightness or pressure, radiation to left arm, jaw or back, or diaphoresis.. Denies headaches, light headedness, weakness, visual disturbances.     History reviewed. No pertinent past medical history. History reviewed. No pertinent past surgical history. No family history on file. Social History  Substance Use Topics  . Smoking status: Current Every Day Smoker -- 0.50 packs/day    Types: Cigarettes  . Smokeless tobacco: None  . Alcohol Use: No    Review of Systems  Ten systems reviewed and are negative for acute change, except as noted in the HPI.    Allergies  Review of patient's allergies indicates no known allergies.  Home Medications   Prior to Admission medications   Medication Sig Start Date End Date Taking? Authorizing Provider  aspirin-acetaminophen-caffeine (EXCEDRIN MIGRAINE) [phone] MG per tablet Take 1-2 tablets by mouth every 6 (six) hours as needed for headache.    Historical Provider, MD  HYDROcodone-acetaminophen (NORCO/VICODIN)  5-325 MG per tablet Take 1 tablet by mouth every 4 (four) hours as needed for moderate pain. 07/27/14   Azalia Bilis, MD  ibuprofen (ADVIL,MOTRIN) 200 MG tablet Take 400 mg by mouth every 4 (four) hours as needed for headache.    Historical Provider, MD  naproxen (NAPROSYN) 500 MG tablet Take 1 tablet (500 mg total) by mouth 2 (two) times daily. 07/27/14   Azalia Bilis, MD  naproxen sodium (ANAPROX) 220 MG tablet Take 440 mg by mouth 2 (two) times daily as needed (headache).    Historical Provider, MD   BP 122/83 mmHg  Pulse 77  Resp 13  SpO2 100% Physical Exam  Constitutional: He appears well-developed and well-nourished. No distress.  HENT:  Head: Normocephalic and atraumatic.  Eyes: Conjunctivae are normal. No scleral icterus.  Neck: Normal range of motion. Neck supple.  Cardiovascular: Normal rate, regular rhythm and normal heart sounds.   Pulmonary/Chest: Effort normal and breath sounds normal. No respiratory distress.  Abdominal: Soft. There is no tenderness.  Scaphoid abdomen. Moderate guarding. Exquisitely ttp in RLQ. Mildly tender in LLQ, without Rovsings. No CVA.    Musculoskeletal: He exhibits no edema.  Neurological: He is alert.  Skin: Skin is warm and dry. He is not diaphoretic.  Psychiatric: His behavior is normal.  Nursing note and vitals reviewed.   ED Course  Procedures (including critical care time) Labs Review Labs Reviewed  CBC WITH DIFFERENTIAL/PLATELET  COMPREHENSIVE METABOLIC PANEL  LIPASE, BLOOD  URINALYSIS, ROUTINE W REFLEX MICROSCOPIC (NOT AT [facility])    Imaging Review No results found. I have personally reviewed and evaluated these images and lab results as part of my medical  decision-making.   EKG Interpretation None      MDM   Final diagnoses:  None    BP 135/92 mmHg  Pulse 87  Temp(Src) 97.8 F (36.6 C) (Oral)  Resp 13  SpO2 100%  Patient with confirmed acute appendicitis on CT.No evidence of perforation. I have spoken with  Ashok NorrisEmina Riebock, PA-c who will admit the patient for the surgery service. His pain is well controlled. He is given flagyl and    Arthor Captainbigail Tonee Silverstein, PA-C 11/12/15 1735  Doug SouSam Jacubowitz, MD 11/13/15 1558

## 2015-11-10 NOTE — Transfer of Care (Signed)
Immediate Anesthesia Transfer of Care Note  Patient: William LedgerKerry R Madlock  Procedure(s) Performed: Procedure(s): APPENDECTOMY LAPAROSCOPIC (N/A)  Patient Location: PACU  Anesthesia Type:General  Level of Consciousness: awake, alert  and oriented  Airway & Oxygen Therapy: Patient Spontanous Breathing  Post-op Assessment: Report given to RN and Post -op Vital signs reviewed and stable  Post vital signs: Reviewed and stable  Last Vitals:  Filed Vitals:   11/10/15 1645 11/10/15 1930  BP: 129/94   Pulse: 88   Temp:  36.5 C  Resp: 13     Last Pain:  Filed Vitals:   11/10/15 1931  PainSc: 5          Complications: No apparent anesthesia complications

## 2015-11-10 NOTE — Anesthesia Preprocedure Evaluation (Signed)
Anesthesia Evaluation  Patient identified by MRN, date of birth, ID band Patient awake    Reviewed: Allergy & Precautions, NPO status , Patient's Chart, lab work & pertinent test results  Airway Mallampati: II  TM Distance: >3 FB Neck ROM: Full    Dental no notable dental hx.    Pulmonary Current Smoker,    Pulmonary exam normal breath sounds clear to auscultation       Cardiovascular negative cardio ROS Normal cardiovascular exam Rhythm:Regular Rate:Normal     Neuro/Psych negative neurological ROS  negative psych ROS   GI/Hepatic negative GI ROS, Neg liver ROS,   Endo/Other  negative endocrine ROS  Renal/GU negative Renal ROS  negative genitourinary   Musculoskeletal negative musculoskeletal ROS (+)   Abdominal   Peds negative pediatric ROS (+)  Hematology negative hematology ROS (+)   Anesthesia Other Findings   Reproductive/Obstetrics negative OB ROS                             Anesthesia Physical Anesthesia Plan  ASA: II  Anesthesia Plan: General   Post-op Pain Management:    Induction: Intravenous and Rapid sequence  Airway Management Planned: Oral ETT  Additional Equipment:   Intra-op Plan:   Post-operative Plan: Extubation in OR  Informed Consent: I have reviewed the patients History and Physical, chart, labs and discussed the procedure including the risks, benefits and alternatives for the proposed anesthesia with the patient or authorized representative who has indicated his/her understanding and acceptance.   Dental advisory given  Plan Discussed with: CRNA and Surgeon  Anesthesia Plan Comments:         Anesthesia Quick Evaluation  

## 2015-11-10 NOTE — H&P (Signed)
William Simon is an 30 y.o. male.   Chief Complaint: abd pain HPI: 30 yo WM who developed generalized abd pain on Sat which persisted prompting him to come to ED for evaluation. CT showed appendicitis. Pain has been present since Saturday. Started around umbilicus. Been constant but would get better and worse at times. Today became focused in RLQ. No fever, chills, n/v. Had 4 bms yesterday. No prior symptoms.   1 pack of cig will last about 1.5 days. Some occassional THC but other drugs/etoh.   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  History reviewed. No pertinent past surgical history.  No family history on file. Social History:  reports that he has been smoking Cigarettes.  He has been smoking about 0.50 packs per day. He does not have any smokeless tobacco history on file. He reports that he does not drink alcohol or use illicit drugs.  Allergies: No Known Allergies   (Not in a hospital admission)  Results for orders placed or performed during the hospital encounter of 11/10/15 (from the past 48 hour(s))  CBC with Differential     Status: Abnormal   Collection Time: 11/10/15 11:03 AM  Result Value Ref Range   WBC 13.8 (H) 4.0 - 10.5 K/uL   RBC 5.00 4.22 - 5.81 MIL/uL   Hemoglobin 14.9 13.0 - 17.0 g/dL   HCT 45.1 39.0 - 52.0 %   MCV 90.2 78.0 - 100.0 fL   MCH 29.8 26.0 - 34.0 pg   MCHC 33.0 30.0 - 36.0 g/dL   RDW 12.7 11.5 - 15.5 %   Platelets 300 150 - 400 K/uL   Neutrophils Relative % 75 %   Neutro Abs 10.3 (H) 1.7 - 7.7 K/uL   Lymphocytes Relative 13 %   Lymphs Abs 1.8 0.7 - 4.0 K/uL   Monocytes Relative 11 %   Monocytes Absolute 1.5 (H) 0.1 - 1.0 K/uL   Eosinophils Relative 1 %   Eosinophils Absolute 0.2 0.0 - 0.7 K/uL   Basophils Relative 0 %   Basophils Absolute 0.0 0.0 - 0.1 K/uL  Comprehensive metabolic panel     Status: Abnormal   Collection Time: 11/10/15 11:03 AM  Result Value Ref Range   Sodium 140 135 - 145 mmol/L   Potassium 4.1 3.5  - 5.1 mmol/L   Chloride 106 101 - 111 mmol/L   CO2 27 22 - 32 mmol/L   Glucose, Bld 86 65 - 99 mg/dL   BUN 10 6 - 20 mg/dL   Creatinine, Ser 0.98 0.61 - 1.24 mg/dL   Calcium 9.3 8.9 - 10.3 mg/dL   Total Protein 7.0 6.5 - 8.1 g/dL   Albumin 4.0 3.5 - 5.0 g/dL   AST 13 (L) 15 - 41 U/L   ALT 7 (L) 17 - 63 U/L   Alkaline Phosphatase 48 38 - 126 U/L   Total Bilirubin 0.8 0.3 - 1.2 mg/dL   GFR calc non Af Amer >60 >60 mL/min   GFR calc Af Amer >60 >60 mL/min    Comment: (NOTE) The eGFR has been calculated using the CKD EPI equation. This calculation has not been validated in all clinical situations. eGFR's persistently <60 mL/min signify possible Chronic Kidney Disease.    Anion gap 7 5 - 15  Lipase, blood     Status: None   Collection Time: 11/10/15 11:03 AM  Result Value Ref Range   Lipase 22 11 - 51 U/L  Urinalysis, Routine w reflex microscopic (not  at [facility])     Status: Abnormal   Collection Time: 11/10/15 12:58 PM  Result Value Ref Range   Color, Urine AMBER (A) YELLOW    Comment: BIOCHEMICALS MAY BE AFFECTED BY COLOR   APPearance CLEAR CLEAR   Specific Gravity, Urine 1.026 1.005 - 1.030   pH 5.5 5.0 - 8.0   Glucose, UA NEGATIVE NEGATIVE mg/dL   Hgb urine dipstick NEGATIVE NEGATIVE   Bilirubin Urine SMALL (A) NEGATIVE   Ketones, ur 15 (A) NEGATIVE mg/dL   Protein, ur NEGATIVE NEGATIVE mg/dL   Nitrite NEGATIVE NEGATIVE   Leukocytes, UA NEGATIVE NEGATIVE    Comment: MICROSCOPIC NOT DONE ON URINES WITH NEGATIVE PROTEIN, BLOOD, LEUKOCYTES, NITRITE, OR GLUCOSE <1000 mg/dL.   Ct Abdomen Pelvis W Contrast  11/10/2015  CLINICAL DATA:  Right lower quadrant pain for several days, initial encounter EXAM: CT ABDOMEN AND PELVIS WITH CONTRAST TECHNIQUE: Multidetector CT imaging of the abdomen and pelvis was performed using the standard protocol following bolus administration of intravenous contrast. CONTRAST:  112m ISOVUE-300 IOPAMIDOL (ISOVUE-300) INJECTION 61% COMPARISON:  None.  FINDINGS: Lung bases are free of acute infiltrate or sizable effusion. The liver, gallbladder, spleen, adrenal glands and pancreas are within normal limits. The kidneys are well visualized bilaterally without renal calculi or obstructive changes. Increased inflammatory change is noted in the right lower quadrant surrounding the appendix which measures at least 10 mm in dimension. These changes are consistent with acute appendicitis. No perforation is noted. Minimal free pelvic fluid is seen. The bladder is well distended. No pelvic mass lesion is noted. The osseous structures show no acute abnormality. IMPRESSION: Changes consistent with acute appendicitis with periappendiceal inflammatory change. No perforation is identified. Electronically Signed   By: MInez CatalinaM.D.   On: 11/10/2015 15:51    Review of Systems  Constitutional: Negative for weight loss.  HENT: Negative for nosebleeds.   Eyes: Negative for blurred vision.  Respiratory: Negative for shortness of breath.   Cardiovascular: Negative for chest pain, palpitations, orthopnea and PND.       Denies DOE  Gastrointestinal: Positive for abdominal pain. Negative for heartburn, vomiting, blood in stool and melena.  Genitourinary: Negative for dysuria and hematuria.  Musculoskeletal: Negative.   Skin: Negative for itching and rash.  Neurological: Negative for dizziness, focal weakness, seizures, loss of consciousness and headaches.       Denies TIAs, amaurosis fugax  Endo/Heme/Allergies: Does not bruise/bleed easily.  Psychiatric/Behavioral: The patient is not nervous/anxious.     Blood pressure 129/94, pulse 88, temperature 97.8 F (36.6 C), temperature source Oral, resp. rate 13, SpO2 100 %. Physical Exam  Vitals reviewed. Constitutional: He is oriented to person, place, and time. He appears well-developed and well-nourished. No distress.  HENT:  Head: Normocephalic and atraumatic.  Right Ear: External ear normal.  Left Ear:  External ear normal.  Eyes: Conjunctivae are normal. No scleral icterus.  Neck: Normal range of motion. Neck supple. No tracheal deviation present. No thyromegaly present.  Cardiovascular: Normal rate and normal heart sounds.   Respiratory: Effort normal and breath sounds normal. No stridor. No respiratory distress. He has no wheezes.  GI: Soft. He exhibits no distension. There is tenderness in the right lower quadrant. There is tenderness at McBurney's point. There is no rigidity and no rebound.    TTP in RLQ, +voluntary guarding.   Musculoskeletal: He exhibits no edema or tenderness.  Lymphadenopathy:    He has no cervical adenopathy.  Neurological: He is alert and oriented to  person, place, and time. He exhibits normal muscle tone.  Skin: Skin is warm and dry. No rash noted. He is not diaphoretic. No erythema. No pallor.  Psychiatric: He has a normal mood and affect. His behavior is normal. Judgment and thought content normal.     Assessment/Plan We discussed the etiology and management of acute appendicitis. We discussed operative and nonoperative management.  I recommended operative management along with IV antibiotics.  We discussed laparoscopic appendectomy. We discussed the risk and benefits of surgery including but not limited to bleeding, infection, injury to surrounding structures, need to convert to an open procedure, blood clot formation, post operative abscess or wound infection, staple line complications such as leak or bleeding, hernia formation, post operative ileus, need for additional procedures, anesthesia complications, and the typical postoperative course. I explained that the patient should expect a good improvement in their symptoms.  Dr Ramirez to perform surgery later this evening.   Eric M. Wilson, MD, FACS General, Bariatric, & Minimally Invasive Surgery [facility]   WILSON,ERIC M, MD 11/10/2015, 5:13 PM    

## 2015-11-10 NOTE — ED Notes (Signed)
Patient starting to drink contrast at this time; CT tech informed him he needed to drink both bottles over a 2 hour span

## 2015-11-10 NOTE — ED Notes (Signed)
Patient almost done with drinking his contrast for his CT scan

## 2015-11-11 ENCOUNTER — Telehealth: Payer: Self-pay | Admitting: General Surgery

## 2015-11-11 MED ORDER — HYDROCODONE-ACETAMINOPHEN 5-325 MG PO TABS: 1.0000 | ORAL_TABLET | ORAL | Status: DC | PRN

## 2015-11-11 NOTE — Telephone Encounter (Signed)
Pt's mother called stating he has vomited several times since being discharged today.  Denies fevers, pain ok.  Tolerating liquids ok.  Recommended all liquid diet until nausea better.  She will also try otc anti-emetics.  Instructed to call the office if this persists through tomorrow.

## 2015-11-11 NOTE — Discharge Instructions (Signed)

## 2015-11-11 NOTE — Progress Notes (Signed)
Discharge paperwork given to patient. No questions verbalized. Prescriptions given. Patient is ready for discharge.

## 2015-11-11 NOTE — Discharge Summary (Signed)
  Physician Discharge Summary  Patient ID: [name] MRN: [id number] DOB/AGE: 02/23/1986 29 y.o.  Admit date: 11/10/2015 Discharge date: 11/11/2015  Admitting Diagnosis: Acute appendicitis  Discharge Diagnosis Patient Active Problem List   Diagnosis Date Noted  . S/P appendectomy 11/10/2015    Consultants none  Imaging: Ct Abdomen Pelvis W Contrast  11/10/2015  CLINICAL DATA:  Right lower quadrant pain for several days, initial encounter EXAM: CT ABDOMEN AND PELVIS WITH CONTRAST TECHNIQUE: Multidetector CT imaging of the abdomen and pelvis was performed using the standard protocol following bolus administration of intravenous contrast. CONTRAST:  100mL ISOVUE-300 IOPAMIDOL (ISOVUE-300) INJECTION 61% COMPARISON:  None. FINDINGS: Lung bases are free of acute infiltrate or sizable effusion. The liver, gallbladder, spleen, adrenal glands and pancreas are within normal limits. The kidneys are well visualized bilaterally without renal calculi or obstructive changes. Increased inflammatory change is noted in the right lower quadrant surrounding the appendix which measures at least 10 mm in dimension. These changes are consistent with acute appendicitis. No perforation is noted. Minimal free pelvic fluid is seen. The bladder is well distended. No pelvic mass lesion is noted. The osseous structures show no acute abnormality. IMPRESSION: Changes consistent with acute appendicitis with periappendiceal inflammatory change. No perforation is identified. Electronically Signed   By: Alcide CleverMark  Lukens M.D.   On: 11/10/2015 15:51    Procedures Laparoscopic appendectomy---Dr. Blanchie Serveamirez  Hospital Course:  William Simon is a healthy male who [facility].  Workup showed acute appendicitis.  Patient was admitted and underwent procedure listed above.  Tolerated procedure well and was transferred to the floor.  Diet was advanced as tolerated.  On POD#1, the patient was voiding well, tolerating  diet, ambulating well, pain well controlled, vital signs stable, incisions c/d/i and felt stable for discharge home.  Medication risks, benefits and therapeutic alternatives were reviewed with the patient.  He verbalizes understanding.   Patient will follow up in our office in 3 weeks and knows to call with questions or concerns.  Physical Exam: General:  Alert, NAD, pleasant, comfortable Abd:  Soft, ND, mild tenderness, incisions C/D/I    Medication List    STOP taking these medications        ibuprofen 200 MG tablet  Commonly known as:  ADVIL,MOTRIN      TAKE these medications        HYDROcodone-acetaminophen 5-325 MG tablet  Commonly known as:  NORCO/VICODIN  Take 1-2 tablets by mouth every 4 (four) hours as needed for moderate pain.     naproxen 500 MG tablet  Commonly known as:  NAPROSYN  Take 1 tablet (500 mg total) by mouth 2 (two) times daily.             Follow-up Information    Follow up with [facility] On 12/02/2015.   Specialty:  General Surgery   Why:  arrive by 8AM for a 8:30AM post op check   Contact information:   [address] [place] [address] [phone]       Signed: Ashok Norrismina Shatoya Roets, [facility] [phone]  11/11/2015, 8:12 AM

## 2015-11-12 ENCOUNTER — Encounter (HOSPITAL_COMMUNITY): Payer: Self-pay | Admitting: General Surgery

## 2015-12-23 ENCOUNTER — Encounter (HOSPITAL_COMMUNITY): Payer: Self-pay

## 2015-12-23 ENCOUNTER — Emergency Department (HOSPITAL_COMMUNITY): Payer: Self-pay

## 2015-12-23 ENCOUNTER — Emergency Department (HOSPITAL_COMMUNITY)
Admission: EM | Admit: 2015-12-23 | Discharge: 2015-12-23 | Disposition: A | Discharge status: Home / Self Care | Payer: Self-pay | Attending: Emergency Medicine | Admitting: Emergency Medicine

## 2015-12-23 DIAGNOSIS — R109 Unspecified abdominal pain: Secondary | ICD-10-CM

## 2015-12-23 DIAGNOSIS — F17210 Nicotine dependence, cigarettes, uncomplicated: Secondary | ICD-10-CM | POA: Insufficient documentation

## 2015-12-23 DIAGNOSIS — K529 Noninfective gastroenteritis and colitis, unspecified: Secondary | ICD-10-CM | POA: Insufficient documentation

## 2015-12-23 LAB — COMPREHENSIVE METABOLIC PANEL
ALBUMIN: 5 g/dL (ref 3.5–5.0)
ALT: 9 U/L — ABNORMAL LOW (ref 17–63)
ANION GAP: 8 (ref 5–15)
AST: 15 U/L (ref 15–41)
Alkaline Phosphatase: 60 U/L (ref 38–126)
BUN: 29 mg/dL — ABNORMAL HIGH (ref 6–20)
CALCIUM: 10.1 mg/dL (ref 8.9–10.3)
CHLORIDE: 105 mmol/L (ref 101–111)
CO2: 26 mmol/L (ref 22–32)
Creatinine, Ser: 1.24 mg/dL (ref 0.61–1.24)
GFR calc non Af Amer: >60 mL/min (ref >60)
Glucose, Bld: 110 mg/dL — ABNORMAL HIGH (ref 65–99)
POTASSIUM: 4.5 mmol/L (ref 3.5–5.1)
SODIUM: 139 mmol/L (ref 135–145)
Total Bilirubin: 0.8 mg/dL (ref 0.3–1.2)
Total Protein: 8.4 g/dL — ABNORMAL HIGH (ref 6.5–8.1)

## 2015-12-23 LAB — URINALYSIS, ROUTINE W REFLEX MICROSCOPIC
Glucose, UA: NEGATIVE mg/dL
Hgb urine dipstick: NEGATIVE
Ketones, ur: 15 mg/dL — AB
Leukocytes, UA: NEGATIVE
NITRITE: NEGATIVE
Protein, ur: 30 mg/dL — AB
SPECIFIC GRAVITY, URINE: 1.033 — AB (ref 1.005–1.030)
pH: 6 (ref 5.0–8.0)

## 2015-12-23 LAB — CBC
HEMATOCRIT: 57.3 % — AB (ref 39.0–52.0)
HEMOGLOBIN: 19.4 g/dL — AB (ref 13.0–17.0)
MCH: 31 pg (ref 26.0–34.0)
MCHC: 33.9 g/dL (ref 30.0–36.0)
MCV: 91.7 fL (ref 78.0–100.0)
Platelets: 388 10*3/uL (ref 150–400)
RBC: 6.25 MIL/uL — AB (ref 4.22–5.81)
RDW: 13.4 % (ref 11.5–15.5)
WBC: 11.4 10*3/uL — ABNORMAL HIGH (ref 4.0–10.5)

## 2015-12-23 LAB — LIPASE, BLOOD: LIPASE: 41 U/L (ref 11–51)

## 2015-12-23 LAB — URINE MICROSCOPIC-ADD ON
Bacteria, UA: NONE SEEN
RBC / HPF: NONE SEEN RBC/hpf (ref 0–5)

## 2015-12-23 MED ORDER — IOPAMIDOL (ISOVUE-300) INJECTION 61%
INTRAVENOUS | Status: AC
  Administered 2015-12-23: 75 mL via INTRAVENOUS
  Filled 2015-12-23: qty 100

## 2015-12-23 MED ORDER — ONDANSETRON 4 MG PO TBDP: 4.0000 mg | ORAL_TABLET | Freq: Three times a day (TID) | ORAL | Status: DC | PRN

## 2015-12-23 MED ORDER — SODIUM CHLORIDE 0.9 % IV BOLUS (SEPSIS)
1000.0000 mL | Freq: Once | INTRAVENOUS | Status: AC
  Administered 2015-12-23: 1000 mL via INTRAVENOUS

## 2015-12-23 MED ORDER — MORPHINE SULFATE (PF) 4 MG/ML IV SOLN
4.0000 mg | Freq: Once | INTRAVENOUS | Status: AC
  Administered 2015-12-23: 4 mg via INTRAVENOUS
  Filled 2015-12-23: qty 1

## 2015-12-23 MED ORDER — DIATRIZOATE MEGLUMINE & SODIUM 66-10 % PO SOLN
ORAL | Status: AC
  Filled 2015-12-23: qty 30

## 2015-12-23 NOTE — Discharge Instructions (Signed)
Follow up with your primary care doctor about your hospital visit. Continue to hydrate orally with small sips of fluids throughout the day. Use Zofran as directed for nausea & vomiting.  ° °The 'BRAT' diet is suggested, then progress to diet as tolerated as symptoms abate.  °Bananas.  °Rice.  °Applesauce.  °Toast (and other simple starches such as crackers, potatoes, noodles).  ° °SEEK IMMEDIATE MEDICAL ATTENTION IF: °You begin having localized abdominal pain that does not go away or becomes severe °A temperature above 101 develops °Repeated vomiting occurs (multiple uncontrollable episodes) or you are unable to keep fluids down °Blood is being passed in stools or vomit (bright red or black tarry stools).  °If you develop chest pain, difficulty breathing, dizziness or fainting, or become confused, poorly responsive, or inconsolable (young children). °

## 2015-12-23 NOTE — ED Notes (Signed)
Patient here with 4 days of abdominal pain with vomiting and diarrhea. States that he recently had appendix out early June.

## 2015-12-23 NOTE — ED Provider Notes (Signed)
CSN: [id number]     Arrival date & time 12/23/15  0813 History   First MD Initiated Contact with Patient 12/23/15 [phone]     Chief Complaint  Patient presents with  . Abdominal Pain     (Consider location/radiation/quality/duration/timing/severity/associated sxs/prior Treatment) The history is provided by the patient and medical records. No language interpreter was used.   William Simon is a 30 y.o. male  with a PMH of GERD and appendectomy on 11/10/2015 who presents to the Emergency Department complaining of acute onset of abdominal pain Saturday night (4 days ago) which acutely worsened last night. Pain is described as sharp at times, especially with movements; achy at times, mostly with rest. Associated symptoms include nausea, non-bloody diarrhea. Patient states since Sunday, he will eat at night around 6-7pm, then throw up the next morning around 4-5 am: no other emesis throughout the day. He has been taking only fluids during the day. No medications taken PTA for symptoms. Pain better in fetal position, worse with eating. Denies fever/chills, constipation, chest pain, sob.   Past Medical History  Diagnosis Date  . GERD (gastroesophageal reflux disease)    Past Surgical History  Procedure Laterality Date  . Appendectomy  11/10/2015  . Laparoscopic appendectomy N/A 11/10/2015    Procedure: APPENDECTOMY LAPAROSCOPIC;  Surgeon: Axel Filler, MD;  Location: [facility] OR;  Service: General;  Laterality: N/A;   No family history on file. Social History  Substance Use Topics  . Smoking status: Current Every Day Smoker -- 0.50 packs/day for 17 years    Types: Cigarettes  . Smokeless tobacco: Current User    Types: Snuff  . Alcohol Use: Yes     Comment: 11/10/2015 "might have 1 drink twice/month, if that"    Review of Systems  Constitutional: Negative for fever and chills.  HENT: Negative for congestion.   Eyes: Negative for visual disturbance.  Respiratory: Negative for cough and shortness  of breath.   Cardiovascular: Negative.   Gastrointestinal: Positive for nausea, vomiting, abdominal pain and diarrhea. Negative for constipation and blood in stool.  Genitourinary: Negative for dysuria.  Musculoskeletal: Negative for myalgias and back pain.  Skin: Negative for rash.  Neurological: Negative for headaches.      Allergies  Review of patient's allergies indicates no known allergies.  Home Medications   Prior to Admission medications   Medication Sig Start Date End Date Taking? Authorizing Provider  ibuprofen (ADVIL,MOTRIN) 200 MG tablet Take 200 mg by mouth every 6 (six) hours as needed (pain).   Yes Historical Provider, MD   BP 113/80 mmHg  Pulse 85  Temp(Src) 97.9 F (36.6 C) (Oral)  Resp 18  SpO2 100% Physical Exam  Constitutional: He is oriented to person, place, and time. He appears well-developed and well-nourished. No distress.  HENT:  Head: Normocephalic and atraumatic.  Tacky mucus membranes.  Cardiovascular: Normal rate, regular rhythm, normal heart sounds and intact distal pulses.  Exam reveals no gallop and no friction rub.   No murmur heard. Pulmonary/Chest: Effort normal and breath sounds normal. No respiratory distress. He has no wheezes. He has no rales. He exhibits no tenderness.  Abdominal: Soft. He exhibits no distension. There is tenderness.    Hyperactive bowel sounds. TTP as depicted in image: Right > Left.  Musculoskeletal: He exhibits no edema.  Neurological: He is alert and oriented to person, place, and time.  Skin: Skin is warm and dry.  Nursing note and vitals reviewed.   ED Course  Procedures (including critical  care time) Labs Review Labs Reviewed  COMPREHENSIVE METABOLIC PANEL - Abnormal; Notable for the following:    Glucose, Bld 110 (*)    BUN 29 (*)    Total Protein 8.4 (*)    ALT 9 (*)    All other components within normal limits  CBC - Abnormal; Notable for the following:    WBC 11.4 (*)    RBC 6.25 (*)     Hemoglobin 19.4 (*)    HCT 57.3 (*)    All other components within normal limits  URINALYSIS, ROUTINE W REFLEX MICROSCOPIC (NOT [facility]) - Abnormal; Notable for the following:    Color, Urine AMBER (*)    Specific Gravity, Urine 1.033 (*)    Bilirubin Urine SMALL (*)    Ketones, ur 15 (*)    Protein, ur 30 (*)    All other components within normal limits  URINE MICROSCOPIC-ADD ON - Abnormal; Notable for the following:    Squamous Epithelial / LPF 0-5 (*)    Casts HYALINE CASTS (*)    All other components within normal limits  LIPASE, BLOOD    Imaging Review No results found. I have personally reviewed and evaluated these images and lab results as part of my medical decision-making.   EKG Interpretation None      MDM   Final diagnoses:  Abdominal pain   William Simon presents to ED for abdominal pain x 4 days with acute worsening yesterday associated with n/v/d. Recent appy on 5/30. Abdominal exam with tenderness to palpation of right abdomen and mild left tenderness, but right much more severe. No peritoneal signs. Appears dry on exam - IV fluids ordered. Will order labs, UA, CT abd, and pain meds - continue to monitor.   9:37 AM - Patient re-evaluated, pain controlled. Fluids running. Will continue to monitor with CT abd/pelvis pending.   UA with signs of dehydration, no concern for infectious etiology. CBC with white count of 11.4 and H&H of 19.4/57.3. CMP and lipase reviewed and reassuring. CT abd with no acute findings. Repeat abdominal exam unchanged.   A&P: Gastroenteritis   - Zofran  - PCP follow up   - Return precautions and home care instructions discussed, all questions answered.   [facility] Memorial HospitalJaime Pilcher Selenne Coggin, PA-C 12/23/15 1221  Tilden FossaElizabeth Rees, MD 12/24/15 [phone]

## 2015-12-23 NOTE — ED Notes (Signed)
Patient transported to CT 

## 2015-12-23 NOTE — ED Notes (Signed)
Patient has had 1/2 contrast bottles.   Patient denies N/V.  Tolerating well.

## 2017-01-10 ENCOUNTER — Encounter (HOSPITAL_COMMUNITY): Payer: Self-pay | Admitting: Emergency Medicine

## 2017-01-10 ENCOUNTER — Emergency Department (HOSPITAL_COMMUNITY): Payer: Self-pay

## 2017-01-10 ENCOUNTER — Emergency Department (HOSPITAL_COMMUNITY)
Admission: EM | Admit: 2017-01-10 | Discharge: 2017-01-10 | Disposition: A | Discharge status: Home / Self Care | Payer: Self-pay | Attending: Emergency Medicine | Admitting: Emergency Medicine

## 2017-01-10 DIAGNOSIS — S63637A Sprain of interphalangeal joint of left little finger, initial encounter: Secondary | ICD-10-CM | POA: Insufficient documentation

## 2017-01-10 DIAGNOSIS — Y939 Activity, unspecified: Secondary | ICD-10-CM | POA: Insufficient documentation

## 2017-01-10 DIAGNOSIS — Y999 Unspecified external cause status: Secondary | ICD-10-CM | POA: Insufficient documentation

## 2017-01-10 DIAGNOSIS — Y929 Unspecified place or not applicable: Secondary | ICD-10-CM | POA: Insufficient documentation

## 2017-01-10 DIAGNOSIS — X509XXA Other and unspecified overexertion or strenuous movements or postures, initial encounter: Secondary | ICD-10-CM | POA: Insufficient documentation

## 2017-01-10 MED ORDER — IBUPROFEN 600 MG PO TABS: 600.0000 mg | ORAL_TABLET | Freq: Four times a day (QID) | ORAL | 0 refills | Status: AC | PRN

## 2017-01-10 MED ORDER — ACETAMINOPHEN 500 MG PO TABS: 500.0000 mg | ORAL_TABLET | Freq: Four times a day (QID) | ORAL | 0 refills | Status: AC | PRN

## 2017-01-10 MED ORDER — IBUPROFEN 200 MG PO TABS
600.0000 mg | ORAL_TABLET | Freq: Once | ORAL | Status: AC
  Administered 2017-01-10: 600 mg via ORAL
  Filled 2017-01-10: qty 1

## 2017-01-10 NOTE — ED Notes (Signed)
Ortho tech at bedside 

## 2017-01-10 NOTE — ED Triage Notes (Signed)
Pt. Stated. I was playing around and injured my let little finger. Ive injured before and it was dislocated.

## 2017-01-10 NOTE — ED Notes (Signed)
ED Provider at bedside. 

## 2017-01-10 NOTE — Progress Notes (Signed)
Orthopedic Tech Progress Note Patient Details:  William LedgerKerry R Simon 1985/07/22 [id number]  Ortho Devices Type of Ortho Device: Finger splint Ortho Device/Splint Location: LUE Ortho Device/Splint Interventions: Ordered, Application   Jennye MoccasinHughes, Daxtin Leiker Craig 01/10/2017, 5:34 PM

## 2017-01-10 NOTE — ED Provider Notes (Signed)
[facility]-EMERGENCY DEPT Provider Note   CSN: [id number] Arrival date & time: 01/10/17  1321  By signing my name below, I, William Simon, attest that this documentation has been prepared under the direction and in the presence of non-physician practitioner, Yarelin Reichardt, Gordy CouncilmanAlexandra, PA-C. Electronically Signed: Rosana Fretana Simon, ED Scribe. 01/10/17. 4:11 PM.  History   Chief Complaint Chief Complaint  Patient presents with  . Finger Injury   The history is provided by the patient. No language interpreter was used.   HPI Comments: William Simon is a 31 y.o. male who presents to the Emergency Department complaining of sudden onset, moderate left pinky finger pain onset last night. Pt has a hx of same injury to finger 8 years ago. Pt felt his finger pop and go sideways. Pt reports associated tingling and swelling to the area. Pt has iced the finger, but no other treatments tried. No other complaints at this time.  Past Medical History:  Diagnosis Date  . GERD (gastroesophageal reflux disease)     Patient Active Problem List   Diagnosis Date Noted  . S/P appendectomy 11/10/2015    Past Surgical History:  Procedure Laterality Date  . APPENDECTOMY  11/10/2015  . LAPAROSCOPIC APPENDECTOMY N/A 11/10/2015   Procedure: APPENDECTOMY LAPAROSCOPIC;  Surgeon: Axel FillerArmando Ramirez, MD;  Location: [facility] OR;  Service: General;  Laterality: N/A;       Home Medications    Prior to Admission medications   Medication Sig Start Date End Date Taking? Authorizing Provider  acetaminophen (TYLENOL) 500 MG tablet Take 1 tablet (500 mg total) by mouth every 6 (six) hours as needed. 01/10/17   Gurshaan Matsuoka, Waylan BogaAlexandra M, PA-C  ibuprofen (ADVIL,MOTRIN) 600 MG tablet Take 1 tablet (600 mg total) by mouth every 6 (six) hours as needed. 01/10/17   Garlan Drewes, Waylan BogaAlexandra M, PA-C  ondansetron (ZOFRAN ODT) 4 MG disintegrating tablet Take 1 tablet (4 mg total) by mouth every 8 (eight) hours as needed for nausea or vomiting. 12/23/15   Ward, Chase PicketJaime  Pilcher, PA-C    Family History No family history on file.  Social History Social History  Substance Use Topics  . Smoking status: Current Every Day Smoker    Packs/day: 0.50    Years: 17.00    Types: Cigarettes  . Smokeless tobacco: Current User    Types: Snuff  . Alcohol use Yes     Comment: 11/10/2015 "might have 1 drink twice/month, if that"     Allergies   Patient has no known allergies.   Review of Systems Review of Systems  Musculoskeletal: Positive for arthralgias and joint swelling.  Neurological: Positive for numbness.     Physical Exam Updated Vital Signs BP (!) 151/101 (BP Location: Right Arm)   Pulse 72   Temp 98 F (36.7 C) (Oral)   Resp 17   Ht 5\' 4"  (1.626 m)   Wt 52.2 kg (115 lb)   SpO2 100%   BMI 19.74 kg/m   Physical Exam  Constitutional: He appears well-developed and well-nourished. No distress.  HENT:  Head: Normocephalic and atraumatic.  Mouth/Throat: Oropharynx is clear and moist. No oropharyngeal exudate.  Eyes: Pupils are equal, round, and reactive to light. Conjunctivae are normal. Right eye exhibits no discharge. Left eye exhibits no discharge. No scleral icterus.  Neck: Normal range of motion. Neck supple. No thyromegaly present.  Cardiovascular: Normal rate, regular rhythm, normal heart sounds and intact distal pulses.  Exam reveals no gallop and no friction rub.   No murmur heard. Pulmonary/Chest: Effort normal  and breath sounds normal. No stridor. No respiratory distress. He has no wheezes. He has no rales.  Abdominal: Soft. Bowel sounds are normal. He exhibits no distension. There is no tenderness. There is no rebound and no guarding.  Musculoskeletal: He exhibits no edema.  L 5th finger: edema and swelling at the PIP joint only; flexion and extension intact, however limited due to pain and swelling; abduction and abduction intact;no laxity with lateral movement;  normal sensation; cap refill <2 sec  Lymphadenopathy:    He has  no cervical adenopathy.  Neurological: He is alert. Coordination normal.  Skin: Skin is warm and dry. No rash noted. He is not diaphoretic. No pallor.  Psychiatric: He has a normal mood and affect.  Nursing note and vitals reviewed.    ED Treatments / Results  DIAGNOSTIC STUDIES: Oxygen Saturation is 100% on RA, normal by my interpretation.   COORDINATION OF CARE: 4:10 PM-Discussed next steps with pt including XR. Pt verbalized understanding and is agreeable with the plan.   Labs (all labs ordered are listed, but only abnormal results are displayed) Labs Reviewed - No data to display  EKG  EKG Interpretation None       Radiology Dg Finger Little Left  Result Date: 01/10/2017 CLINICAL DATA:  Left small finger was bent to the side last night while wrestling with a friend. Patient heard a pop with subsequent pain and swelling at the PIP joint. Prior dislocation of the same finger. Initial encounter. EXAM: LEFT LITTLE FINGER 2+V COMPARISON:  03/18/2007 FINDINGS: There is no evidence of fracture or dislocation. There is no evidence of arthropathy or other focal bone abnormality. Soft tissues are unremarkable. IMPRESSION: Negative. Electronically Signed   By: Sebastian AcheAllen  Grady M.D.   On: 01/10/2017 17:15    Procedures Procedures (including critical care time)  Medications Ordered in ED Medications  ibuprofen (ADVIL,MOTRIN) tablet 600 mg (not administered)     Initial Impression / Assessment and Plan / ED Course  I have reviewed the triage vital signs and the nursing notes.  Pertinent labs & imaging results that were available during my care of the patient were reviewed by me and considered in my medical decision making (see chart for details).     X-ray is negative for fracture or dislocation. Doubt gamekeepers injury considering no laxity with lateral movement. Patient placed in a static finger splint. Supportive treatment discussed including ice, and troponin, Tylenol.  Follow-up to hand surgery if symptoms are continuing. Return precautions discussed. Patient understands and agrees with plan. Patient vitals stable during ED course and discharged in satisfactory condition.  Final Clinical Impressions(s) / ED Diagnoses   Final diagnoses:  Sprain of interphalangeal joint of left little finger, initial encounter    New Prescriptions New Prescriptions   ACETAMINOPHEN (TYLENOL) 500 MG TABLET    Take 1 tablet (500 mg total) by mouth every 6 (six) hours as needed.   IBUPROFEN (ADVIL,MOTRIN) 600 MG TABLET    Take 1 tablet (600 mg total) by mouth every 6 (six) hours as needed.   I personally performed the services described in this documentation, which was scribed in my presence. The recorded information has been reviewed and is accurate.     Emi HolesLaw, Lyndsee Casa M, PA-C 01/10/17 1742    Raeford RazorKohut, Stephen, MD 01/15/17 [phone]

## 2017-01-10 NOTE — Discharge Instructions (Signed)
Medications: ibuprofen, Tylenol  Treatment: You can take ibuprofen or Tylenol every 6 hours or alternate every 3 hours. Use ice 3-4 times daily alternating 20 minutes on, 20 minutes off. Wear your finger splint at all times for support.  Follow-up: Please follow-up with a hand doctor, Dr. Amanda PeaGramig, if your symptoms are not improving. Please return to emergency department if you develop any new or worsening symptoms.

## 2017-10-24 ENCOUNTER — Other Ambulatory Visit: Payer: Self-pay

## 2017-10-24 ENCOUNTER — Encounter (HOSPITAL_COMMUNITY): Payer: Self-pay

## 2017-10-24 ENCOUNTER — Emergency Department (HOSPITAL_COMMUNITY)
Admission: EM | Admit: 2017-10-24 | Discharge: 2017-10-24 | Disposition: A | Discharge status: Home / Self Care | Payer: Self-pay | Attending: Emergency Medicine | Admitting: Emergency Medicine

## 2017-10-24 DIAGNOSIS — F17210 Nicotine dependence, cigarettes, uncomplicated: Secondary | ICD-10-CM | POA: Insufficient documentation

## 2017-10-24 DIAGNOSIS — R197 Diarrhea, unspecified: Secondary | ICD-10-CM | POA: Insufficient documentation

## 2017-10-24 DIAGNOSIS — M545 Low back pain, unspecified: Secondary | ICD-10-CM

## 2017-10-24 DIAGNOSIS — Z79899 Other long term (current) drug therapy: Secondary | ICD-10-CM | POA: Insufficient documentation

## 2017-10-24 LAB — COMPREHENSIVE METABOLIC PANEL
ALT: 11 U/L — ABNORMAL LOW (ref 17–63)
AST: 16 U/L (ref 15–41)
Albumin: 3.4 g/dL — ABNORMAL LOW (ref 3.5–5.0)
Alkaline Phosphatase: 47 U/L (ref 38–126)
Anion gap: 6 (ref 5–15)
BILIRUBIN TOTAL: 0.3 mg/dL (ref 0.3–1.2)
BUN: 11 mg/dL (ref 6–20)
CHLORIDE: 111 mmol/L (ref 101–111)
CO2: 22 mmol/L (ref 22–32)
Calcium: 8.6 mg/dL — ABNORMAL LOW (ref 8.9–10.3)
Creatinine, Ser: 0.81 mg/dL (ref 0.61–1.24)
Glucose, Bld: 99 mg/dL (ref 65–99)
POTASSIUM: 4.1 mmol/L (ref 3.5–5.1)
Sodium: 139 mmol/L (ref 135–145)
TOTAL PROTEIN: 5.6 g/dL — AB (ref 6.5–8.1)

## 2017-10-24 LAB — URINALYSIS, ROUTINE W REFLEX MICROSCOPIC
BILIRUBIN URINE: NEGATIVE
GLUCOSE, UA: NEGATIVE mg/dL
Hgb urine dipstick: NEGATIVE
KETONES UR: NEGATIVE mg/dL
LEUKOCYTES UA: NEGATIVE
Nitrite: NEGATIVE
PH: 6 (ref 5.0–8.0)
PROTEIN: NEGATIVE mg/dL
Specific Gravity, Urine: 1.003 — ABNORMAL LOW (ref 1.005–1.030)

## 2017-10-24 LAB — CBC
HEMATOCRIT: 39.3 % (ref 39.0–52.0)
Hemoglobin: 13.3 g/dL (ref 13.0–17.0)
MCH: 31 pg (ref 26.0–34.0)
MCHC: 33.8 g/dL (ref 30.0–36.0)
MCV: 91.6 fL (ref 78.0–100.0)
PLATELETS: 304 10*3/uL (ref 150–400)
RBC: 4.29 MIL/uL (ref 4.22–5.81)
RDW: 12.4 % (ref 11.5–15.5)
WBC: 8.8 10*3/uL (ref 4.0–10.5)

## 2017-10-24 LAB — LIPASE, BLOOD: Lipase: 35 U/L (ref 11–51)

## 2017-10-24 MED ORDER — LOPERAMIDE HCL 2 MG PO CAPS: 2.0000 mg | ORAL_CAPSULE | Freq: Four times a day (QID) | ORAL | 0 refills | Status: DC | PRN

## 2017-10-24 MED ORDER — DICYCLOMINE HCL 20 MG PO TABS: 20.0000 mg | ORAL_TABLET | Freq: Two times a day (BID) | ORAL | 0 refills | Status: DC

## 2017-10-24 MED ORDER — DICYCLOMINE HCL 10 MG PO CAPS
20.0000 mg | ORAL_CAPSULE | Freq: Once | ORAL | Status: AC
  Administered 2017-10-24: 20 mg via ORAL
  Filled 2017-10-24: qty 2

## 2017-10-24 MED ORDER — LOPERAMIDE HCL 2 MG PO CAPS
4.0000 mg | ORAL_CAPSULE | Freq: Once | ORAL | Status: AC
  Administered 2017-10-24: 4 mg via ORAL
  Filled 2017-10-24: qty 2

## 2017-10-24 NOTE — ED Notes (Signed)
Pt states has had diarrhea x 3 days with back pain, states last ate last night [place]

## 2017-10-24 NOTE — ED Triage Notes (Signed)
Pt c/o diarrhea X3 days. Reports he has been drinking, but decreased eating. Pt also c/o back pain.

## 2017-10-24 NOTE — ED Provider Notes (Signed)
[facility] EMERGENCY DEPARTMENT Provider Note   CSN: [id number] Arrival date & time: 10/24/17  1020     History   Chief Complaint Chief Complaint  Patient presents with  . Diarrhea    HPI William Simon is a 32 y.o. male with no significant past medical history who presents to the emergency department with a chief complaint of diarrhea.  The patient endorses numerous episodes of watery diarrhea over the last week.  He denies blood in his stool or dark black or maroon stools.  He also endorses bilateral low back pain that also began approximately 1 week ago.  He characterizes the pain as cramping that and improves when he sits up and worsens when he lays flat.  He denies nausea, vomiting, fever, chills, dysuria, hematuria, penile or testicular pain or swelling, dizziness, lightheadedness, or syncope.   He reports that he was seen on April 30 and diagnosed with a sinus infection.  He was prescribed Augmentin, which she took for 3 to 4 days, but discontinued the medication after 3 to 4 days.  No other recent antibiotic use.  He was also taking a cough medicine, which he assists discontinued.  No other daily medications.  He reports that sometimes he will take a couple of shots of liquor if he had a hard day at work, last alcohol use was 5 days ago.  He reports that he works in [employer].  No known trauma, injury, new exercises, or activities.  Family history includes Crohn's disease.  Surgical history includes appendectomy.  The history is provided by the patient. No language interpreter was used.  Diarrhea   Pertinent negatives include no abdominal pain, no vomiting, no chills and no headaches.    Past Medical History:  Diagnosis Date  . GERD (gastroesophageal reflux disease)     Patient Active Problem List   Diagnosis Date Noted  . S/P appendectomy 11/10/2015    Past Surgical History:  Procedure Laterality Date  . APPENDECTOMY  11/10/2015  . LAPAROSCOPIC  APPENDECTOMY N/A 11/10/2015   Procedure: APPENDECTOMY LAPAROSCOPIC;  Surgeon: Axel Filler, MD;  Location: [facility] OR;  Service: General;  Laterality: N/A;       Home Medications    Prior to Admission medications   Medication Sig Start Date End Date Taking? Authorizing Provider  acetaminophen (TYLENOL) 500 MG tablet Take 1 tablet (500 mg total) by mouth every 6 (six) hours as needed. 01/10/17   Law, Waylan Boga, PA-C  dicyclomine (BENTYL) 20 MG tablet Take 1 tablet (20 mg total) by mouth 2 (two) times daily. 10/24/17   Nedda Gains A, PA-C  ibuprofen (ADVIL,MOTRIN) 600 MG tablet Take 1 tablet (600 mg total) by mouth every 6 (six) hours as needed. 01/10/17   Law, Waylan Boga, PA-C  loperamide (IMODIUM) 2 MG capsule Take 1 capsule (2 mg total) by mouth 4 (four) times daily as needed for diarrhea or loose stools. 10/24/17   Pravin Perezperez A, PA-C  ondansetron (ZOFRAN ODT) 4 MG disintegrating tablet Take 1 tablet (4 mg total) by mouth every 8 (eight) hours as needed for nausea or vomiting. 12/23/15   Ward, Chase Picket, PA-C    Family History No family history on file.  Social History Social History   Tobacco Use  . Smoking status: Current Every Day Smoker    Packs/day: 0.50    Years: 17.00    Pack years: 8.50    Types: Cigarettes  . Smokeless tobacco: Current User    Types: Snuff  Substance Use Topics  . Alcohol use: Yes    Alcohol/week: 1.2 oz    Types: 2 Shots of liquor per week  . Drug use: Yes    Types: Marijuana     Allergies   Patient has no known allergies.   Review of Systems Review of Systems  Constitutional: Negative for appetite change, chills and fever.  Respiratory: Negative for shortness of breath.   Cardiovascular: Negative for chest pain.  Gastrointestinal: Positive for diarrhea. Negative for abdominal pain, constipation, nausea and vomiting.  Genitourinary: Negative for dysuria, hematuria, penile swelling, scrotal swelling and testicular pain.    Musculoskeletal: Positive for back pain.  Skin: Negative for rash.  Allergic/Immunologic: Negative for immunocompromised state.  Neurological: Negative for dizziness, weakness, numbness and headaches.  Psychiatric/Behavioral: Negative for confusion.     Physical Exam Updated Vital Signs BP (!) 122/95 (BP Location: Right Arm)   Pulse 72   Temp 98.1 F (36.7 C) (Oral)   Resp 20   Ht  (1.626 m)   Wt 52.2 kg (115 lb)   SpO2 100%   BMI 19.74 kg/m   Physical Exam  Constitutional: He appears well-developed.  HENT:  Head: Normocephalic.  Eyes: Conjunctivae are normal.  Neck: Neck supple.  Cardiovascular: Normal rate, regular rhythm, normal heart sounds and intact distal pulses. Exam reveals no gallop and no friction rub.  No murmur heard. Pulmonary/Chest: Effort normal. No stridor. No respiratory distress. He has no wheezes. He has no rales. He exhibits no tenderness.  Abdominal: Soft. Bowel sounds are normal. He exhibits no distension and no mass. There is tenderness. There is no rebound and no guarding. No hernia.  Mild tenderness to palpation to the bilateral lower quadrants, right flank, and bilateral low back.  No rebound  or guarding.  No CVA tenderness bilaterally.  Abdomen is soft, nondistended.  Well-healed surgical incision consistent with previous appendectomy.  No peritoneal signs.  Neurological: He is alert.  Skin: Skin is warm and dry.  Psychiatric: His behavior is normal.  Nursing note and vitals reviewed.    ED Treatments / Results  Labs (all labs ordered are listed, but only abnormal results are displayed) Labs Reviewed  COMPREHENSIVE METABOLIC PANEL - Abnormal; Notable for the following components:      Result Value   Calcium 8.6 (*)    Total Protein 5.6 (*)    Albumin 3.4 (*)    ALT 11 (*)    All other components within normal limits  URINALYSIS, ROUTINE W REFLEX MICROSCOPIC - Abnormal; Notable for the following components:   Color, Urine COLORLESS  (*)    Specific Gravity, Urine 1.003 (*)    All other components within normal limits  LIPASE, BLOOD  CBC    EKG None  Radiology No results found.  Procedures Procedures (including critical care time)  Medications Ordered in ED Medications  dicyclomine (BENTYL) capsule 20 mg (20 mg Oral Given 10/24/17 1450)  loperamide (IMODIUM) capsule 4 mg (4 mg Oral Given 10/24/17 1450)     Initial Impression / Assessment and Plan / ED Course  I have reviewed the triage vital signs and the nursing notes.  Pertinent labs & imaging results that were available during my care of the patient were reviewed by me and considered in my medical decision making (see chart for details).     32 year old male with no significant past medical history presenting with nonbloody diarrhea for the last week.  He also endorses bilateral low back pain.  On  exam, he has reproducible bilateral paraspinal muscular low back pain that is also present to the bilateral flanks and lateral aspects of the right and left lower quadrants.  The remainder of the abdominal exam is unremarkable.  He is nontoxic and well-appearing.  Labs are reassuring.  No signs of dehydration.  No leukocytosis.  He does have a family history of Crohn's disease however, I do not feel that this is an initial presentation of a Crohn's flare at this time given his exam and labs.  The patient did have 3 to 4 days of Augmentin at home, but discontinued the medicine.  No prolonged antibiotic administration.  Doubt colitis, diverticulitis, C. Difficile at this time.  Bentyl and low provide given in the ED.  Strict return precautions to the ED given.  Will discharge the patient to home with Rx for same.  He is hemodynamically stable in no acute distress.  He is safe for discharge to home with outpatient follow-up if symptoms do not improve at this time.  Final Clinical Impressions(s) / ED Diagnoses   Final diagnoses:  Diarrhea, unspecified type  Acute  bilateral low back pain without sciatica    ED Discharge Orders        Ordered    dicyclomine (BENTYL) 20 MG tablet  2 times daily     10/24/17 1412    loperamide (IMODIUM) 2 MG capsule  4 times daily PRN     10/24/17 1412       Terril Chestnut A, PA-C 10/24/17 1826    Pricilla Loveless, MD 10/25/17 [phone]

## 2017-10-24 NOTE — Discharge Instructions (Signed)
Thank you for allowing me to provide your care today in the emergency department.  Take 1 tablet of Bentyl up to 2 times daily for abdominal cramping.  Take 1 tablet of loperamide every 6 hours as needed for diarrhea or loose stools.  It is important to stay hydrated while you are having diarrhea.  If your symptoms do not improve in the next 3 days, please call and schedule follow-up appointment with [facility] health and wellness.  If you develop significantly worsening symptoms including blood in your diarrhea, continuous vomiting, high fever, if you pass out, or other new concerning symptoms, please return to the emergency department for re-evaluation.

## 2018-06-11 ENCOUNTER — Ambulatory Visit
Admission: EM | Admit: 2018-06-11 | Discharge: 2018-06-11 | Disposition: A | Discharge status: Home / Self Care | Payer: Self-pay | Attending: Family Medicine | Admitting: Family Medicine

## 2018-06-11 DIAGNOSIS — J101 Influenza due to other identified influenza virus with other respiratory manifestations: Secondary | ICD-10-CM

## 2018-06-11 DIAGNOSIS — R062 Wheezing: Secondary | ICD-10-CM

## 2018-06-11 LAB — POCT INFLUENZA A/B
INFLUENZA A, POC: NEGATIVE
Influenza B, POC: POSITIVE — AB

## 2018-06-11 LAB — POCT RAPID STREP A (OFFICE): Rapid Strep A Screen: NEGATIVE

## 2018-06-11 MED ORDER — ALBUTEROL SULFATE HFA 108 (90 BASE) MCG/ACT IN AERS
1.0000 | INHALATION_SPRAY | Freq: Four times a day (QID) | RESPIRATORY_TRACT | 0 refills | Status: AC | PRN
Start: 2018-06-11 — End: ?

## 2018-06-11 MED ORDER — OSELTAMIVIR PHOSPHATE 75 MG PO CAPS: 75.0000 mg | ORAL_CAPSULE | Freq: Two times a day (BID) | ORAL | 0 refills | Status: AC

## 2018-06-11 MED ORDER — PREDNISONE 10 MG PO TABS: 40.0000 mg | ORAL_TABLET | Freq: Every day | ORAL | 0 refills | Status: AC

## 2018-06-11 MED ORDER — IPRATROPIUM-ALBUTEROL 0.5-2.5 (3) MG/3ML IN SOLN
3.0000 mL | Freq: Once | RESPIRATORY_TRACT | Status: AC
  Administered 2018-06-11: 3 mL via RESPIRATORY_TRACT

## 2018-06-11 NOTE — Discharge Instructions (Signed)
Your flu test was positive I am treating the inflammation in your lungs with a DuoNeb here in clinic I am also sending some prednisone to take for the next 5 days and an albuterol inhaler to your pharmacy Tamiflu for the flulike symptoms For continued or worsening symptoms please follow-up

## 2018-06-11 NOTE — ED Triage Notes (Signed)
Pt c/o center abdominal pain with nausea, diarrhea and no appetite x5 days; states drinking fluid but no food in 5 days; states been around girlfriends kids that had a stomach bug. C/o cold sx's also

## 2018-06-13 NOTE — ED Provider Notes (Signed)
[facility]    CSN: [id number] Arrival date & time: 06/11/18  1141     History   Chief Complaint Chief Complaint  Patient presents with  . Diarrhea    HPI William Simon is a 33 y.o. male.   Patient is a 33 year old male that presents with approximately 5 days of nausea, vomiting, diarrhea, cough, congestion.  His symptoms have been constant and remain the same.  He has had no appetite and has not eaten in 5 days.  He has been able to drink fluids and hold those down.  He has been using over-the-counter cough congestion medication and Pepto-Bismol for his symptoms.  He has had no relief with the medication.  He has had subjective fever, body aches, chills.  Was recently around his girlfriend's children that had been diagnosed with a stomach virus.  Denies any history of asthma.  He is a current everyday smoker.  ROS per HPI    Diarrhea    Past Medical History:  Diagnosis Date  . GERD (gastroesophageal reflux disease)     Patient Active Problem List   Diagnosis Date Noted  . S/P appendectomy 11/10/2015    Past Surgical History:  Procedure Laterality Date  . APPENDECTOMY  11/10/2015  . LAPAROSCOPIC APPENDECTOMY N/A 11/10/2015   Procedure: APPENDECTOMY LAPAROSCOPIC;  Surgeon: Axel Filler, MD;  Location: [facility] OR;  Service: General;  Laterality: N/A;       Home Medications    Prior to Admission medications   Medication Sig Start Date End Date Taking? Authorizing Provider  acetaminophen (TYLENOL) 500 MG tablet Take 1 tablet (500 mg total) by mouth every 6 (six) hours as needed. 01/10/17   Law, Waylan Boga, PA-C  albuterol (PROVENTIL HFA;VENTOLIN HFA) 108 (90 Base) MCG/ACT inhaler Inhale 1-2 puffs into the lungs every 6 (six) hours as needed for wheezing or shortness of breath. 06/11/18   Dahlia Byes A, NP  ibuprofen (ADVIL,MOTRIN) 600 MG tablet Take 1 tablet (600 mg total) by mouth every 6 (six) hours as needed. 01/10/17   Law, Waylan Boga, PA-C  oseltamivir  (TAMIFLU) 75 MG capsule Take 1 capsule (75 mg total) by mouth every 12 (twelve) hours. 06/11/18   Dahlia Byes A, NP  predniSONE (DELTASONE) 10 MG tablet Take 4 tablets (40 mg total) by mouth daily for 5 days. 06/11/18 06/16/18  Janace Aris, NP    Family History No family history on file.  Social History Social History   Tobacco Use  . Smoking status: Current Every Day Smoker    Packs/day: 0.50    Years: 17.00    Pack years: 8.50    Types: Cigarettes  . Smokeless tobacco: Current User    Types: Snuff  Substance Use Topics  . Alcohol use: Yes    Alcohol/week: 2.0 standard drinks    Types: 2 Shots of liquor per week  . Drug use: Yes    Types: Marijuana     Allergies   Patient has no known allergies.   Review of Systems Review of Systems  Gastrointestinal: Positive for diarrhea.     Physical Exam Triage Vital Signs ED Triage Vitals  Enc Vitals Group     BP 06/11/18 1319 117/81     Pulse Rate 06/11/18 1319 (!) 106     Resp 06/11/18 1319 18     Temp 06/11/18 1319 98.9 F (37.2 C)     Temp Source 06/11/18 1319 Oral     SpO2 06/11/18 1319 97 %  Weight --      Height --      Head Circumference --      Peak Flow --      Pain Score 06/11/18 1320 8     Pain Loc --      Pain Edu? --      Excl. in [facility]? --    No data found.  Updated Vital Signs BP 117/81 (BP Location: Left Arm)   Pulse (!) 106   Temp 98.9 F (37.2 C) (Oral)   Resp 18   SpO2 97%   Visual Acuity Right Eye Distance:   Left Eye Distance:   Bilateral Distance:    Right Eye Near:   Left Eye Near:    Bilateral Near:     Physical Exam Constitutional:      Appearance: He is ill-appearing.  HENT:     Head: Normocephalic and atraumatic.     Right Ear: Tympanic membrane, ear canal and external ear normal.     Left Ear: Tympanic membrane, ear canal and external ear normal.     Nose: Congestion and rhinorrhea present.     Mouth/Throat:     Pharynx: Oropharynx is clear. Posterior oropharyngeal  erythema present.  Eyes:     Conjunctiva/sclera: Conjunctivae normal.  Neck:     Musculoskeletal: Normal range of motion.  Cardiovascular:     Rate and Rhythm: Regular rhythm. Tachycardia present.     Heart sounds: Normal heart sounds.  Pulmonary:     Effort: Pulmonary effort is normal.     Breath sounds: Wheezing present.  Musculoskeletal: Normal range of motion.  Lymphadenopathy:     Cervical: Cervical adenopathy present.  Skin:    General: Skin is warm and dry.  Neurological:     Mental Status: He is alert.  Psychiatric:        Mood and Affect: Mood normal.      UC Treatments / Results  Labs (all labs ordered are listed, but only abnormal results are displayed) Labs Reviewed  POCT INFLUENZA A/B - Abnormal; Notable for the following components:      Result Value   Influenza B, POC Positive (*)    All other components within normal limits  POCT RAPID STREP A (OFFICE)    EKG None  Radiology No results found.  Procedures Procedures (including critical care time)  Medications Ordered in UC Medications  ipratropium-albuterol (DUONEB) 0.5-2.5 (3) MG/3ML nebulizer solution 3 mL (3 mLs Nebulization Given 06/11/18 1500)    Initial Impression / Assessment and Plan / UC Course  I have reviewed the triage vital signs and the nursing notes.  Pertinent labs & imaging results that were available during my care of the patient were reviewed by me and considered in my medical decision making (see chart for details).    33 year old male with flulike symptoms. DuoNeb given in clinic for wheezing.  Lung sounds slightly improved after DuoNeb.  Sending home with prednisone and albuterol inhaler to use as needed for cough, wheezing, shortness of breath. Patient's flu test was positive for flu B. Giving Tamiflu for symptoms Instructed that if his symptoms continue or worsen to please follow-up Final Clinical Impressions(s) / UC Diagnoses   Final diagnoses:  Influenza B    Wheezing     Discharge Instructions     Your flu test was positive I am treating the inflammation in your lungs with a DuoNeb here in clinic I am also sending some prednisone to take for the next 5 days  and an albuterol inhaler to your pharmacy Tamiflu for the flulike symptoms For continued or worsening symptoms please follow-up     ED Prescriptions    Medication Sig Dispense Auth. Provider   oseltamivir (TAMIFLU) 75 MG capsule Take 1 capsule (75 mg total) by mouth every 12 (twelve) hours. 10 capsule Kyndell Zeiser A, NP   predniSONE (DELTASONE) 10 MG tablet Take 4 tablets (40 mg total) by mouth daily for 5 days. 20 tablet Cyncere Sontag A, NP   albuterol (PROVENTIL HFA;VENTOLIN HFA) 108 (90 Base) MCG/ACT inhaler Inhale 1-2 puffs into the lungs every 6 (six) hours as needed for wheezing or shortness of breath. 1 Inhaler Dahlia ByesBast, Lincy Belles A, NP     Controlled Substance Prescriptions [place] Controlled Substance Registry consulted? Not Applicable   Janace ArisBast, Rylei Masella A, NP 06/13/18 1223

## 2019-11-14 ENCOUNTER — Emergency Department (HOSPITAL_COMMUNITY)
Admission: EM | Admit: 2019-11-14 | Discharge: 2019-11-15 | Discharge status: Against Medical Advice | Payer: Self-pay | Attending: Emergency Medicine | Admitting: Emergency Medicine

## 2019-11-14 ENCOUNTER — Encounter (HOSPITAL_COMMUNITY): Payer: Self-pay

## 2019-11-14 ENCOUNTER — Other Ambulatory Visit: Payer: Self-pay

## 2019-11-14 DIAGNOSIS — Z5321 Procedure and treatment not carried out due to patient leaving prior to being seen by health care provider: Secondary | ICD-10-CM | POA: Insufficient documentation

## 2019-11-14 DIAGNOSIS — Y939 Activity, unspecified: Secondary | ICD-10-CM | POA: Insufficient documentation

## 2019-11-14 DIAGNOSIS — Y999 Unspecified external cause status: Secondary | ICD-10-CM | POA: Insufficient documentation

## 2019-11-14 DIAGNOSIS — Y929 Unspecified place or not applicable: Secondary | ICD-10-CM | POA: Insufficient documentation

## 2019-11-14 DIAGNOSIS — W25XXXA Contact with sharp glass, initial encounter: Secondary | ICD-10-CM | POA: Insufficient documentation

## 2019-11-14 DIAGNOSIS — S61411A Laceration without foreign body of right hand, initial encounter: Secondary | ICD-10-CM | POA: Insufficient documentation

## 2019-11-14 MED ORDER — OXYCODONE-ACETAMINOPHEN 5-325 MG PO TABS
1.0000 | ORAL_TABLET | Freq: Once | ORAL | Status: AC
  Administered 2019-11-14: 1 via ORAL
  Filled 2019-11-14: qty 1

## 2019-11-14 NOTE — ED Triage Notes (Signed)
Pt arrives to ED w/ c/o laceration to R hand after putting hand through a window. Bleeding controlled in triage, pt reports 10/10 pain.

## 2019-11-14 NOTE — ED Notes (Signed)
Called three times without answer

## 2020-12-25 ENCOUNTER — Inpatient Hospital Stay (HOSPITAL_COMMUNITY): Payer: Self-pay

## 2020-12-25 ENCOUNTER — Inpatient Hospital Stay (HOSPITAL_COMMUNITY): Payer: Self-pay | Admitting: Anesthesiology

## 2020-12-25 ENCOUNTER — Encounter (HOSPITAL_COMMUNITY): Payer: Self-pay

## 2020-12-25 ENCOUNTER — Inpatient Hospital Stay (HOSPITAL_COMMUNITY)
Admission: EM | Admit: 2020-12-25 | Discharge: 2021-01-04 | DRG: 492 | Disposition: A | Discharge status: Home / Self Care | Payer: Self-pay | Attending: Surgery | Admitting: Surgery

## 2020-12-25 ENCOUNTER — Emergency Department (HOSPITAL_COMMUNITY): Payer: Self-pay

## 2020-12-25 ENCOUNTER — Encounter (HOSPITAL_COMMUNITY): Admission: EM | Disposition: A | Discharge status: Home / Self Care | Payer: Self-pay | Source: Home / Self Care

## 2020-12-25 DIAGNOSIS — F1120 Opioid dependence, uncomplicated: Secondary | ICD-10-CM | POA: Diagnosis present

## 2020-12-25 DIAGNOSIS — N2889 Other specified disorders of kidney and ureter: Secondary | ICD-10-CM | POA: Diagnosis present

## 2020-12-25 DIAGNOSIS — T148XXA Other injury of unspecified body region, initial encounter: Secondary | ICD-10-CM

## 2020-12-25 DIAGNOSIS — F513 Sleepwalking [somnambulism]: Secondary | ICD-10-CM | POA: Diagnosis present

## 2020-12-25 DIAGNOSIS — S42301A Unspecified fracture of shaft of humerus, right arm, initial encounter for closed fracture: Secondary | ICD-10-CM | POA: Diagnosis present

## 2020-12-25 DIAGNOSIS — E278 Other specified disorders of adrenal gland: Secondary | ICD-10-CM | POA: Diagnosis present

## 2020-12-25 DIAGNOSIS — S82831A Other fracture of upper and lower end of right fibula, initial encounter for closed fracture: Secondary | ICD-10-CM | POA: Diagnosis present

## 2020-12-25 DIAGNOSIS — R64 Cachexia: Secondary | ICD-10-CM | POA: Diagnosis present

## 2020-12-25 DIAGNOSIS — Z681 Body mass index (BMI) 19 or less, adult: Secondary | ICD-10-CM

## 2020-12-25 DIAGNOSIS — S82391A Other fracture of lower end of right tibia, initial encounter for closed fracture: Secondary | ICD-10-CM

## 2020-12-25 DIAGNOSIS — F17210 Nicotine dependence, cigarettes, uncomplicated: Secondary | ICD-10-CM | POA: Diagnosis present

## 2020-12-25 DIAGNOSIS — S80211A Abrasion, right knee, initial encounter: Secondary | ICD-10-CM | POA: Diagnosis present

## 2020-12-25 DIAGNOSIS — S82441A Displaced spiral fracture of shaft of right fibula, initial encounter for closed fracture: Secondary | ICD-10-CM | POA: Diagnosis present

## 2020-12-25 DIAGNOSIS — F17200 Nicotine dependence, unspecified, uncomplicated: Secondary | ICD-10-CM | POA: Diagnosis present

## 2020-12-25 DIAGNOSIS — T1490XA Injury, unspecified, initial encounter: Secondary | ICD-10-CM | POA: Diagnosis present

## 2020-12-25 DIAGNOSIS — D62 Acute posthemorrhagic anemia: Secondary | ICD-10-CM | POA: Diagnosis not present

## 2020-12-25 DIAGNOSIS — Z419 Encounter for procedure for purposes other than remedying health state, unspecified: Secondary | ICD-10-CM

## 2020-12-25 DIAGNOSIS — S82301A Unspecified fracture of lower end of right tibia, initial encounter for closed fracture: Secondary | ICD-10-CM | POA: Diagnosis present

## 2020-12-25 DIAGNOSIS — S42391A Other fracture of shaft of right humerus, initial encounter for closed fracture: Secondary | ICD-10-CM

## 2020-12-25 DIAGNOSIS — F1910 Other psychoactive substance abuse, uncomplicated: Secondary | ICD-10-CM | POA: Diagnosis present

## 2020-12-25 DIAGNOSIS — Y929 Unspecified place or not applicable: Secondary | ICD-10-CM

## 2020-12-25 DIAGNOSIS — S92251A Displaced fracture of navicular [scaphoid] of right foot, initial encounter for closed fracture: Secondary | ICD-10-CM | POA: Diagnosis present

## 2020-12-25 DIAGNOSIS — Z23 Encounter for immunization: Secondary | ICD-10-CM

## 2020-12-25 DIAGNOSIS — S82141A Displaced bicondylar fracture of right tibia, initial encounter for closed fracture: Principal | ICD-10-CM | POA: Diagnosis present

## 2020-12-25 DIAGNOSIS — U071 COVID-19: Secondary | ICD-10-CM | POA: Diagnosis present

## 2020-12-25 DIAGNOSIS — Z5900 Homelessness unspecified: Secondary | ICD-10-CM

## 2020-12-25 DIAGNOSIS — S82101A Unspecified fracture of upper end of right tibia, initial encounter for closed fracture: Secondary | ICD-10-CM | POA: Diagnosis present

## 2020-12-25 HISTORY — PX: ORIF HUMERUS FRACTURE: SHX2126

## 2020-12-25 HISTORY — PX: TIBIA IM NAIL INSERTION: SHX2516

## 2020-12-25 HISTORY — DX: Unspecified fracture of shaft of humerus, right arm, initial encounter for closed fracture: S42.301A

## 2020-12-25 LAB — COMPREHENSIVE METABOLIC PANEL
ALT: 64 U/L — ABNORMAL HIGH (ref 0–44)
AST: 33 U/L (ref 15–41)
Albumin: 3.3 g/dL — ABNORMAL LOW (ref 3.5–5.0)
Alkaline Phosphatase: 83 U/L (ref 38–126)
Anion gap: 9 (ref 5–15)
BUN: 19 mg/dL (ref 6–20)
CO2: 27 mmol/L (ref 22–32)
Calcium: 8.6 mg/dL — ABNORMAL LOW (ref 8.9–10.3)
Chloride: 102 mmol/L (ref 98–111)
Creatinine, Ser: 0.88 mg/dL (ref 0.61–1.24)
GFR, Estimated: >60 mL/min (ref >60)
Glucose, Bld: 171 mg/dL — ABNORMAL HIGH (ref 70–99)
Potassium: 3.5 mmol/L (ref 3.5–5.1)
Sodium: 138 mmol/L (ref 135–145)
Total Bilirubin: 0.5 mg/dL (ref 0.3–1.2)
Total Protein: 5.6 g/dL — ABNORMAL LOW (ref 6.5–8.1)

## 2020-12-25 LAB — RAPID URINE DRUG SCREEN, HOSP PERFORMED
Amphetamines: POSITIVE — AB
Barbiturates: NOT DETECTED
Benzodiazepines: NOT DETECTED
Cocaine: NOT DETECTED
Opiates: POSITIVE — AB
Tetrahydrocannabinol: NOT DETECTED

## 2020-12-25 LAB — URINALYSIS, ROUTINE W REFLEX MICROSCOPIC
Bilirubin Urine: NEGATIVE
Glucose, UA: NEGATIVE mg/dL
Hgb urine dipstick: NEGATIVE
Ketones, ur: 5 mg/dL — AB
Leukocytes,Ua: NEGATIVE
Nitrite: NEGATIVE
Protein, ur: NEGATIVE mg/dL
Specific Gravity, Urine: >1.046 — ABNORMAL HIGH (ref 1.005–1.030)
pH: 5 (ref 5.0–8.0)

## 2020-12-25 LAB — CBC
HCT: 36.2 % — ABNORMAL LOW (ref 39.0–52.0)
HCT: 36.8 % — ABNORMAL LOW (ref 39.0–52.0)
HCT: 32.4 % — ABNORMAL LOW (ref 39.0–52.0)
Hemoglobin: 12.2 g/dL — ABNORMAL LOW (ref 13.0–17.0)
Hemoglobin: 12 g/dL — ABNORMAL LOW (ref 13.0–17.0)
Hemoglobin: 10.7 g/dL — ABNORMAL LOW (ref 13.0–17.0)
MCH: 30.5 pg (ref 26.0–34.0)
MCH: 30.2 pg (ref 26.0–34.0)
MCH: 30.1 pg (ref 26.0–34.0)
MCHC: 33.7 g/dL (ref 30.0–36.0)
MCHC: 32.6 g/dL (ref 30.0–36.0)
MCHC: 33 g/dL (ref 30.0–36.0)
MCV: 90.5 fL (ref 80.0–100.0)
MCV: 92.5 fL (ref 80.0–100.0)
MCV: 91.3 fL (ref 80.0–100.0)
Platelets: 211 10*3/uL (ref 150–400)
Platelets: 147 10*3/uL — ABNORMAL LOW (ref 150–400)
Platelets: 186 10*3/uL (ref 150–400)
RBC: 4 MIL/uL — ABNORMAL LOW (ref 4.22–5.81)
RBC: 3.98 MIL/uL — ABNORMAL LOW (ref 4.22–5.81)
RBC: 3.55 MIL/uL — ABNORMAL LOW (ref 4.22–5.81)
RDW: 13.1 % (ref 11.5–15.5)
RDW: 13 % (ref 11.5–15.5)
RDW: 13.2 % (ref 11.5–15.5)
WBC: 9.5 10*3/uL (ref 4.0–10.5)
WBC: 7.5 10*3/uL (ref 4.0–10.5)
WBC: 5.5 10*3/uL (ref 4.0–10.5)
nRBC: 0 % (ref 0.0–0.2)
nRBC: 0 % (ref 0.0–0.2)
nRBC: 0 % (ref 0.0–0.2)

## 2020-12-25 LAB — I-STAT CHEM 8, ED
BUN: 19 mg/dL (ref 6–20)
Calcium, Ion: 1.05 mmol/L — ABNORMAL LOW (ref 1.15–1.40)
Chloride: 102 mmol/L (ref 98–111)
Creatinine, Ser: 0.8 mg/dL (ref 0.61–1.24)
Glucose, Bld: 166 mg/dL — ABNORMAL HIGH (ref 70–99)
HCT: 34 % — ABNORMAL LOW (ref 39.0–52.0)
Hemoglobin: 11.6 g/dL — ABNORMAL LOW (ref 13.0–17.0)
Potassium: 3.4 mmol/L — ABNORMAL LOW (ref 3.5–5.1)
Sodium: 139 mmol/L (ref 135–145)
TCO2: 25 mmol/L (ref 22–32)

## 2020-12-25 LAB — PROTIME-INR
INR: 1 (ref 0.8–1.2)
Prothrombin Time: 12.8 seconds (ref 11.4–15.2)

## 2020-12-25 LAB — ABO/RH: ABO/RH(D): A POS

## 2020-12-25 LAB — RESP PANEL BY RT-PCR (FLU A&B, COVID) ARPGX2
Influenza A by PCR: NEGATIVE
Influenza B by PCR: NEGATIVE
SARS Coronavirus 2 by RT PCR: POSITIVE — AB

## 2020-12-25 LAB — SAMPLE TO BLOOD BANK

## 2020-12-25 LAB — LACTIC ACID, PLASMA: Lactic Acid, Venous: 2 mmol/L (ref 0.5–1.9)

## 2020-12-25 LAB — ETHANOL: Alcohol, Ethyl (B): <10 mg/dL (ref <10)

## 2020-12-25 LAB — HIV ANTIBODY (ROUTINE TESTING W REFLEX): HIV Screen 4th Generation wRfx: NONREACTIVE

## 2020-12-25 SURGERY — OPEN REDUCTION INTERNAL FIXATION (ORIF) PROXIMAL HUMERUS FRACTURE
Anesthesia: General | Site: Leg Lower | Laterality: Right

## 2020-12-25 MED ORDER — FENTANYL CITRATE (PF) 100 MCG/2ML IJ SOLN
100.0000 ug | INTRAMUSCULAR | Status: DC | PRN
  Administered 2020-12-25 (×2): 100 ug via INTRAVENOUS
  Filled 2020-12-25 (×2): qty 2

## 2020-12-25 MED ORDER — 0.9 % SODIUM CHLORIDE (POUR BTL) OPTIME
TOPICAL | Status: DC | PRN
  Administered 2020-12-25: 1000 mL

## 2020-12-25 MED ORDER — FENTANYL CITRATE (PF) 250 MCG/5ML IJ SOLN
INTRAMUSCULAR | Status: AC
  Filled 2020-12-25: qty 5

## 2020-12-25 MED ORDER — FENTANYL CITRATE (PF) 250 MCG/5ML IJ SOLN
INTRAMUSCULAR | Status: DC | PRN
  Administered 2020-12-25 (×2): 100 ug via INTRAVENOUS
  Administered 2020-12-25: 50 ug via INTRAVENOUS

## 2020-12-25 MED ORDER — METHOCARBAMOL 500 MG PO TABS
500.0000 mg | ORAL_TABLET | Freq: Four times a day (QID) | ORAL | Status: DC | PRN
  Administered 2020-12-25 – 2020-12-26 (×3): 500 mg via ORAL
  Filled 2020-12-25 (×3): qty 1

## 2020-12-25 MED ORDER — LACTATED RINGERS IV SOLN: INTRAVENOUS | Status: DC | PRN

## 2020-12-25 MED ORDER — TETANUS-DIPHTH-ACELL PERTUSSIS 5-2.5-18.5 LF-MCG/0.5 IM SUSY
0.5000 mL | PREFILLED_SYRINGE | Freq: Once | INTRAMUSCULAR | Status: AC
  Administered 2020-12-25: 0.5 mL via INTRAMUSCULAR
  Filled 2020-12-25: qty 0.5

## 2020-12-25 MED ORDER — PHENYLEPHRINE 40 MCG/ML (10ML) SYRINGE FOR IV PUSH (FOR BLOOD PRESSURE SUPPORT)
PREFILLED_SYRINGE | INTRAVENOUS | Status: AC
  Filled 2020-12-25: qty 10

## 2020-12-25 MED ORDER — HYDROMORPHONE HCL 1 MG/ML IJ SOLN
INTRAMUSCULAR | Status: DC | PRN
  Administered 2020-12-25: .5 mg via INTRAVENOUS

## 2020-12-25 MED ORDER — LIDOCAINE 2% (20 MG/ML) 5 ML SYRINGE
INTRAMUSCULAR | Status: AC
  Filled 2020-12-25: qty 5

## 2020-12-25 MED ORDER — ONDANSETRON 4 MG PO TBDP: 4.0000 mg | ORAL_TABLET | Freq: Four times a day (QID) | ORAL | Status: DC | PRN

## 2020-12-25 MED ORDER — PROPOFOL 10 MG/ML IV BOLUS
INTRAVENOUS | Status: AC
  Filled 2020-12-25: qty 20

## 2020-12-25 MED ORDER — DOCUSATE SODIUM 100 MG PO CAPS
100.0000 mg | ORAL_CAPSULE | Freq: Two times a day (BID) | ORAL | Status: DC
  Administered 2020-12-25 – 2020-12-31 (×13): 100 mg via ORAL
  Filled 2020-12-25 (×15): qty 1

## 2020-12-25 MED ORDER — SODIUM CHLORIDE 0.9% IV SOLUTION: Freq: Once | INTRAVENOUS | Status: DC

## 2020-12-25 MED ORDER — BACITRACIN ZINC 500 UNIT/GM EX OINT
TOPICAL_OINTMENT | Freq: Two times a day (BID) | CUTANEOUS | Status: DC
  Administered 2020-12-25 – 2021-01-03 (×7): 1 via TOPICAL
  Filled 2020-12-25: qty 1.8
  Filled 2020-12-25: qty 28.4

## 2020-12-25 MED ORDER — SUGAMMADEX SODIUM 200 MG/2ML IV SOLN
INTRAVENOUS | Status: DC | PRN
  Administered 2020-12-25: 300 mg via INTRAVENOUS

## 2020-12-25 MED ORDER — SODIUM CHLORIDE 0.9 % IV SOLN
INTRAVENOUS | Status: AC | PRN
  Administered 2020-12-25: 1000 mL via INTRAVENOUS

## 2020-12-25 MED ORDER — OXYCODONE HCL 5 MG PO TABS
5.0000 mg | ORAL_TABLET | ORAL | Status: DC | PRN
  Administered 2020-12-25 – 2021-01-04 (×41): 10 mg via ORAL
  Filled 2020-12-25 (×42): qty 2

## 2020-12-25 MED ORDER — LACTATED RINGERS IV BOLUS
1000.0000 mL | Freq: Once | INTRAVENOUS | Status: AC
  Administered 2020-12-25: 1000 mL via INTRAVENOUS

## 2020-12-25 MED ORDER — SUCCINYLCHOLINE CHLORIDE 200 MG/10ML IV SOSY
PREFILLED_SYRINGE | INTRAVENOUS | Status: DC | PRN
  Administered 2020-12-25: 140 mg via INTRAVENOUS

## 2020-12-25 MED ORDER — ONDANSETRON HCL 4 MG/2ML IJ SOLN
INTRAMUSCULAR | Status: AC
  Filled 2020-12-25: qty 2

## 2020-12-25 MED ORDER — ONDANSETRON HCL 4 MG/2ML IJ SOLN: 4.0000 mg | Freq: Four times a day (QID) | INTRAMUSCULAR | Status: DC | PRN

## 2020-12-25 MED ORDER — ACETAMINOPHEN 500 MG PO TABS
1000.0000 mg | ORAL_TABLET | Freq: Four times a day (QID) | ORAL | Status: DC
  Administered 2020-12-25 – 2021-01-04 (×39): 1000 mg via ORAL
  Filled 2020-12-25 (×40): qty 2

## 2020-12-25 MED ORDER — MIDAZOLAM HCL 2 MG/2ML IJ SOLN
INTRAMUSCULAR | Status: AC
  Filled 2020-12-25: qty 2

## 2020-12-25 MED ORDER — PROPOFOL 10 MG/ML IV BOLUS
INTRAVENOUS | Status: DC | PRN
  Administered 2020-12-25: 130 mg via INTRAVENOUS

## 2020-12-25 MED ORDER — DEXAMETHASONE SODIUM PHOSPHATE 10 MG/ML IJ SOLN
INTRAMUSCULAR | Status: AC
  Filled 2020-12-25: qty 1

## 2020-12-25 MED ORDER — HYDROMORPHONE HCL 1 MG/ML IJ SOLN
INTRAMUSCULAR | Status: AC
  Filled 2020-12-25: qty 0.5

## 2020-12-25 MED ORDER — FENTANYL CITRATE (PF) 100 MCG/2ML IJ SOLN: 50.0000 ug | INTRAMUSCULAR | Status: DC | PRN

## 2020-12-25 MED ORDER — SUCCINYLCHOLINE CHLORIDE 200 MG/10ML IV SOSY
PREFILLED_SYRINGE | INTRAVENOUS | Status: AC
  Filled 2020-12-25: qty 10

## 2020-12-25 MED ORDER — LIDOCAINE 2% (20 MG/ML) 5 ML SYRINGE
INTRAMUSCULAR | Status: DC | PRN
  Administered 2020-12-25: 100 mg via INTRAVENOUS

## 2020-12-25 MED ORDER — CEFAZOLIN SODIUM-DEXTROSE 2-3 GM-%(50ML) IV SOLR
INTRAVENOUS | Status: DC | PRN
  Administered 2020-12-25 (×2): 2 g via INTRAVENOUS

## 2020-12-25 MED ORDER — DEXAMETHASONE SODIUM PHOSPHATE 10 MG/ML IJ SOLN
INTRAMUSCULAR | Status: DC | PRN
  Administered 2020-12-25: 10 mg via INTRAVENOUS

## 2020-12-25 MED ORDER — PROPOFOL 10 MG/ML IV BOLUS
INTRAVENOUS | Status: AC
  Administered 2020-12-25: 40 mg via INTRAVENOUS
  Filled 2020-12-25: qty 20

## 2020-12-25 MED ORDER — PROPOFOL 10 MG/ML IV BOLUS
INTRAVENOUS | Status: AC | PRN
  Administered 2020-12-25: 40 mg via INTRAVENOUS

## 2020-12-25 MED ORDER — ALBUMIN HUMAN 5 % IV SOLN: INTRAVENOUS | Status: DC | PRN

## 2020-12-25 MED ORDER — PROPOFOL 10 MG/ML IV BOLUS: 40.0000 mg | Freq: Once | INTRAVENOUS | Status: AC

## 2020-12-25 MED ORDER — FENTANYL CITRATE (PF) 100 MCG/2ML IJ SOLN
25.0000 ug | INTRAMUSCULAR | Status: DC | PRN
  Administered 2020-12-25 (×2): 50 ug via INTRAVENOUS

## 2020-12-25 MED ORDER — FENTANYL CITRATE (PF) 100 MCG/2ML IJ SOLN
100.0000 ug | Freq: Once | INTRAMUSCULAR | Status: AC
  Administered 2020-12-25: 100 ug via INTRAVENOUS

## 2020-12-25 MED ORDER — FENTANYL CITRATE (PF) 100 MCG/2ML IJ SOLN
INTRAMUSCULAR | Status: AC
  Filled 2020-12-25: qty 2

## 2020-12-25 MED ORDER — MORPHINE SULFATE (PF) 4 MG/ML IV SOLN
4.0000 mg | INTRAVENOUS | Status: DC | PRN
Start: 2020-12-25 — End: 2021-01-04
  Administered 2020-12-25 – 2021-01-03 (×21): 4 mg via INTRAVENOUS
  Filled 2020-12-25 (×22): qty 1

## 2020-12-25 MED ORDER — ONDANSETRON HCL 4 MG/2ML IJ SOLN
INTRAMUSCULAR | Status: DC | PRN
  Administered 2020-12-25: 4 mg via INTRAVENOUS

## 2020-12-25 MED ORDER — EPHEDRINE 5 MG/ML INJ
INTRAVENOUS | Status: AC
  Filled 2020-12-25: qty 10

## 2020-12-25 MED ORDER — MIDAZOLAM HCL 2 MG/2ML IJ SOLN
INTRAMUSCULAR | Status: DC | PRN
  Administered 2020-12-25: 2 mg via INTRAVENOUS

## 2020-12-25 MED ORDER — ROCURONIUM BROMIDE 10 MG/ML (PF) SYRINGE
PREFILLED_SYRINGE | INTRAVENOUS | Status: DC | PRN
  Administered 2020-12-25 (×2): 30 mg via INTRAVENOUS
  Administered 2020-12-25: 60 mg via INTRAVENOUS
  Administered 2020-12-25: 30 mg via INTRAVENOUS
  Administered 2020-12-25: 10 mg via INTRAVENOUS

## 2020-12-25 MED ORDER — PHENYLEPHRINE 40 MCG/ML (10ML) SYRINGE FOR IV PUSH (FOR BLOOD PRESSURE SUPPORT)
PREFILLED_SYRINGE | INTRAVENOUS | Status: DC | PRN
  Administered 2020-12-25: 120 ug via INTRAVENOUS
  Administered 2020-12-25 (×2): 80 ug via INTRAVENOUS
  Administered 2020-12-25: 160 ug via INTRAVENOUS
  Administered 2020-12-25: 200 ug via INTRAVENOUS
  Administered 2020-12-25 (×2): 120 ug via INTRAVENOUS
  Administered 2020-12-25: 80 ug via INTRAVENOUS

## 2020-12-25 MED ORDER — LACTATED RINGERS IV SOLN: INTRAVENOUS | Status: DC

## 2020-12-25 MED ORDER — IOHEXOL 300 MG/ML  SOLN
100.0000 mL | Freq: Once | INTRAMUSCULAR | Status: AC | PRN
  Administered 2020-12-25: 100 mL via INTRAVENOUS

## 2020-12-25 MED ORDER — ROCURONIUM BROMIDE 10 MG/ML (PF) SYRINGE
PREFILLED_SYRINGE | INTRAVENOUS | Status: AC
  Filled 2020-12-25: qty 10

## 2020-12-25 SURGICAL SUPPLY — 122 items
BAG COUNTER SPONGE SURGICOUNT (BAG) ×3 IMPLANT
BENZOIN TINCTURE PRP APPL 2/3 (GAUZE/BANDAGES/DRESSINGS) ×6 IMPLANT
BIT DRILL 100X2.5XANTM LCK (BIT) ×2 IMPLANT
BIT DRILL 110X30X2XCALB (BIT) ×2 IMPLANT
BIT DRILL 2.5X2.75 QC CALB (BIT) ×3 IMPLANT
BIT DRILL 2.8 (BIT) ×2
BIT DRILL 3.5X5.5 QC CALB (BIT) ×3 IMPLANT
BIT DRILL CANN QC 2.8X165 (BIT) ×2 IMPLANT
BIT DRILL L45 QC 2.7X125 (BIT) ×2 IMPLANT
BIT DRILL QC 2.0X100 (BIT) ×3
BIT DRILL QC 2.5X135 (BIT) ×3 IMPLANT
BIT DRILL QC 2.7X125 (BIT) ×3
BIT DRL 100X2.5XANTM LCK (BIT) ×2
BIT DRL 110X30X2XCALB (BIT) ×2
BLADE SURG 10 STRL SS (BLADE) ×6 IMPLANT
BNDG ELASTIC 4X5.8 VLCR STR LF (GAUZE/BANDAGES/DRESSINGS) ×3 IMPLANT
BNDG ELASTIC 6X5.8 VLCR STR LF (GAUZE/BANDAGES/DRESSINGS) ×3 IMPLANT
BNDG GAUZE ELAST 4 BULKY (GAUZE/BANDAGES/DRESSINGS) ×6 IMPLANT
BRUSH SCRUB EZ PLAIN DRY (MISCELLANEOUS) ×6 IMPLANT
CATH FOLEY 16FR TEMP PROBE (CATHETERS) ×3 IMPLANT
COVER SURGICAL LIGHT HANDLE (MISCELLANEOUS) ×6 IMPLANT
DRAPE C-ARM 42X72 X-RAY (DRAPES) ×3 IMPLANT
DRAPE C-ARMOR (DRAPES) ×3 IMPLANT
DRAPE HALF SHEET 40X57 (DRAPES) IMPLANT
DRAPE IMP U-DRAPE 54X76 (DRAPES) ×3 IMPLANT
DRAPE INCISE IOBAN 66X45 STRL (DRAPES) ×3 IMPLANT
DRAPE ORTHO SPLIT 77X108 STRL (DRAPES) ×6
DRAPE SURG 17X11 SM STRL (DRAPES) ×6 IMPLANT
DRAPE SURG ORHT 6 SPLT 77X108 (DRAPES) ×4 IMPLANT
DRAPE U-SHAPE 47X51 STRL (DRAPES) ×6 IMPLANT
DRESSING MEPILEX FLEX 4X4 (GAUZE/BANDAGES/DRESSINGS) ×2 IMPLANT
DRILL BIT 2.5MM (BIT) ×3
DRILL BIT 2.8MM (BIT) ×3
DRSG ADAPTIC 3X8 NADH LF (GAUZE/BANDAGES/DRESSINGS) ×3 IMPLANT
DRSG MEPILEX BORDER 4X8 (GAUZE/BANDAGES/DRESSINGS) ×3 IMPLANT
DRSG MEPILEX FLEX 4X4 (GAUZE/BANDAGES/DRESSINGS) ×3
DRSG MEPITEL 4X7.2 (GAUZE/BANDAGES/DRESSINGS) ×6 IMPLANT
DRSG PAD ABDOMINAL 8X10 ST (GAUZE/BANDAGES/DRESSINGS) ×6 IMPLANT
ELECT REM PT RETURN 9FT ADLT (ELECTROSURGICAL) ×3
ELECTRODE REM PT RTRN 9FT ADLT (ELECTROSURGICAL) ×2 IMPLANT
EVACUATOR 1/8 PVC DRAIN (DRAIN) IMPLANT
GAUZE SPONGE 4X4 12PLY STRL (GAUZE/BANDAGES/DRESSINGS) ×3 IMPLANT
GLOVE SRG 8 PF TXTR STRL LF DI (GLOVE) ×2 IMPLANT
GLOVE SURG ENC MOIS LTX SZ7.5 (GLOVE) ×3 IMPLANT
GLOVE SURG ENC MOIS LTX SZ8 (GLOVE) ×3 IMPLANT
GLOVE SURG ENC MOIS LTX SZ8.5 (GLOVE) ×3 IMPLANT
GLOVE SURG UNDER POLY LF SZ7.5 (GLOVE) ×3 IMPLANT
GLOVE SURG UNDER POLY LF SZ8 (GLOVE) ×3
GOWN STRL REUS W/ TWL LRG LVL3 (GOWN DISPOSABLE) ×6 IMPLANT
GOWN STRL REUS W/ TWL XL LVL3 (GOWN DISPOSABLE) ×2 IMPLANT
GOWN STRL REUS W/TWL 2XL LVL3 (GOWN DISPOSABLE) ×3 IMPLANT
GOWN STRL REUS W/TWL LRG LVL3 (GOWN DISPOSABLE) ×9
GOWN STRL REUS W/TWL XL LVL3 (GOWN DISPOSABLE) ×3
IMMOBILIZER KNEE 20 (SOFTGOODS) ×3 IMPLANT
IMMOBILIZER KNEE 20 THIGH 36 (SOFTGOODS) ×2 IMPLANT
K-WIRE ACE 1.6X6 (WIRE) ×6
KIT BASIN OR (CUSTOM PROCEDURE TRAY) ×3 IMPLANT
KIT TURNOVER KIT B (KITS) ×3 IMPLANT
KWIRE ACE 1.6X6 (WIRE) ×4 IMPLANT
NS IRRIG 1000ML POUR BTL (IV SOLUTION) ×3 IMPLANT
PACK ORTHO EXTREMITY (CUSTOM PROCEDURE TRAY) ×3 IMPLANT
PACK TOTAL JOINT (CUSTOM PROCEDURE TRAY) ×3 IMPLANT
PACK UNIVERSAL I (CUSTOM PROCEDURE TRAY) ×3 IMPLANT
PAD ARMBOARD 7.5X6 YLW CONV (MISCELLANEOUS) ×6 IMPLANT
PAD CAST 4YDX4 CTTN HI CHSV (CAST SUPPLIES) ×4 IMPLANT
PADDING CAST COTTON 4X4 STRL (CAST SUPPLIES) ×6
PADDING CAST COTTON 6X4 STRL (CAST SUPPLIES) ×9 IMPLANT
PLATE 9H 184 RT MED DIST TIB (Plate) ×3 IMPLANT
PLATE LOCK 7H STD RT PROX TIB (Plate) ×3 IMPLANT
PROS LCP PLATE 12 163M (Plate) ×3 IMPLANT
PROSTHESIS LCP PLATE 12 163M (Plate) ×2 IMPLANT
SCREW CORT ST 3.5X20 (Screw) ×3 IMPLANT
SCREW CORT ST 3.5X22 (Screw) ×3 IMPLANT
SCREW CORTEX 2.7X22 ST (Screw) ×2 IMPLANT
SCREW CORTICAL 3.5MM  20MM (Screw) ×3 IMPLANT
SCREW CORTICAL 3.5MM 14MM (Screw) ×3 IMPLANT
SCREW CORTICAL 3.5MM 18MM (Screw) ×3 IMPLANT
SCREW CORTICAL 3.5MM 20MM (Screw) ×2 IMPLANT
SCREW CORTICAL LOW PROF 3.5X32 (Screw) ×3 IMPLANT
SCREW LOCK CORT STAR 3.5X32 (Screw) ×6 IMPLANT
SCREW LOCK CORT STAR 3.5X38 (Screw) ×12 IMPLANT
SCREW LOCK CORT STAR 3.5X60 (Screw) ×3 IMPLANT
SCREW LOCK CORT STAR 3.5X65 (Screw) ×6 IMPLANT
SCREW LOCK CORT STAR 3.5X70 (Screw) ×6 IMPLANT
SCREW LOCK T15 FT 22X3.5XST (Screw) ×4 IMPLANT
SCREW LOCK T15 FT 24X3.5X2.9X (Screw) ×4 IMPLANT
SCREW LOCKING 3.5X22 (Screw) ×6 IMPLANT
SCREW LOCKING 3.5X24 (Screw) ×6 IMPLANT
SCREW LOW PROFILE 3.5X30MM TIS (Screw) ×3 IMPLANT
SCREW LP NL T15 3.5X20 (Screw) ×18 IMPLANT
SCREW LP NL T15 3.5X22 (Screw) ×9 IMPLANT
SCREW PELVIC CORTEX 24MM (Screw) ×6 IMPLANT
SCREW SELF TAP 22M (Screw) ×1 IMPLANT
SCREW T15 LP CORT 3.5X36MM NS (Screw) ×3 IMPLANT
SCREW T15 LP CORT 3.5X60MM NS (Screw) ×9 IMPLANT
SCREW T15 LP CORT 3.5X65MM NS (Screw) ×6 IMPLANT
SLING ARM IMMOBILIZER LRG (SOFTGOODS) ×3 IMPLANT
SPLINT PLASTER CAST XFAST 5X30 (CAST SUPPLIES) ×2 IMPLANT
SPLINT PLASTER XFAST SET 5X30 (CAST SUPPLIES) ×1
SPONGE T-LAP 18X18 ~~LOC~~+RFID (SPONGE) ×12 IMPLANT
STAPLER VISISTAT 35W (STAPLE) ×3 IMPLANT
STOCKINETTE IMPERVIOUS LG (DRAPES) ×3 IMPLANT
SUCTION FRAZIER HANDLE 10FR (MISCELLANEOUS) ×3
SUCTION TUBE FRAZIER 10FR DISP (MISCELLANEOUS) ×2 IMPLANT
SUT ETHIBOND 5 LR DA (SUTURE) ×3 IMPLANT
SUT ETHILON 1 TP 1 60 (SUTURE) ×6 IMPLANT
SUT ETHILON 2 0 PSLX (SUTURE) ×12 IMPLANT
SUT ETHILON 3 0 PS 1 (SUTURE) ×6 IMPLANT
SUT FIBERWIRE #2 38 T-5 BLUE (SUTURE)
SUT PDS AB 2-0 CT1 27 (SUTURE) IMPLANT
SUT VIC AB 0 CT1 27 (SUTURE) ×6
SUT VIC AB 0 CT1 27XBRD ANBCTR (SUTURE) ×4 IMPLANT
SUT VIC AB 2-0 CT1 27 (SUTURE) ×12
SUT VIC AB 2-0 CT1 36 (SUTURE) ×3 IMPLANT
SUT VIC AB 2-0 CT1 TAPERPNT 27 (SUTURE) ×8 IMPLANT
SUT VIC AB 2-0 CT3 27 (SUTURE) IMPLANT
SUTURE FIBERWR #2 38 T-5 BLUE (SUTURE) IMPLANT
TOWEL GREEN STERILE (TOWEL DISPOSABLE) ×6 IMPLANT
TOWEL GREEN STERILE FF (TOWEL DISPOSABLE) ×3 IMPLANT
TRAY FOLEY MTR SLVR 16FR STAT (SET/KITS/TRAYS/PACK) IMPLANT
WATER STERILE IRR 1000ML POUR (IV SOLUTION) ×3 IMPLANT
YANKAUER SUCT BULB TIP NO VENT (SUCTIONS) ×3 IMPLANT

## 2020-12-25 NOTE — ED Notes (Addendum)
R arm reduced and splinted, good cap refill
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
## 2020-12-25 NOTE — Progress Notes (Signed)
Pt dw Dr. Bebe Shaggy in ED.  Will need operative treatment of right humerus and right tibia.  Will have full consult note to follow this am.

## 2020-12-25 NOTE — ED Notes (Signed)
Attempted to update mother.  Left message on voicemail.

## 2020-12-25 NOTE — Anesthesia Postprocedure Evaluation (Signed)
Anesthesia Post Note  Patient: William Simon  Procedure(s) Performed: OPEN REDUCTION INTERNAL FIXATION (ORIF) PROXIMAL HUMERUS FRACTURE (Right: Arm Upper) OPEN REDUCTION INTERNAL FIXATION TIBIA  and tibial plateau fractures  AND EXAM UNDER ANESTHESIA  KNEE (Right: Leg Lower)     Patient location during evaluation: PACU Anesthesia Type: General Level of consciousness: awake and alert, patient cooperative and oriented Pain management: pain level controlled Vital Signs Assessment: post-procedure vital signs reviewed and stable Respiratory status: spontaneous breathing, nonlabored ventilation, respiratory function stable and patient connected to nasal cannula oxygen Cardiovascular status: blood pressure returned to baseline and stable Postop Assessment: no apparent nausea or vomiting Anesthetic complications: no   No notable events documented.  Last Vitals:  Vitals:   12/25/20 2035 12/25/20 2050  BP:  (!) 132/91  Pulse:  77  Resp:  13  Temp: (!) 36.3 C   SpO2: 100% 100%    Last Pain:  Vitals:   12/25/20 2050  TempSrc:   PainSc: 7                  [name],E. Adedamola Seto
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
## 2020-12-25 NOTE — ED Notes (Signed)
Vara Guardian mother [phone] would like to speak to the patient

## 2020-12-25 NOTE — Consult Note (Addendum)
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 and pain.  Respiratory:  Negative for cough and shortness of breath.   Cardiovascular:  Negative for chest pain.  Gastrointestinal:  Negative for abdominal pain, nausea and vomiting.  Genitourinary:  Negative for dysuria, flank pain, frequency and urgency.  Musculoskeletal:  Positive for arthralgias (Right upper arm, knee, and lower leg). Negative for back pain, myalgias and neck pain.  Neurological:  Negative for dizziness and headaches.  Hematological:  Does not bruise/bleed easily.  Psychiatric/Behavioral:  The patient is not nervous/anxious.   Blood pressure 127/88, pulse 86, temperature 98.8 F (37.1 C), resp. rate 18, height  (1.626 m), weight 52.2 kg, SpO2 100 %. Physical Exam Constitutional:      General: He is not in acute distress.    Appearance: He is well-developed. He is not diaphoretic.  HENT:     Head: Normocephalic and atraumatic.  Eyes:     General: No scleral icterus.       Right eye: No discharge.        Left eye: No discharge.     Conjunctiva/sclera: Conjunctivae normal.   Cardiovascular:     Rate and Rhythm: Normal rate and regular rhythm.  Pulmonary:     Effort: Pulmonary effort is normal. No respiratory distress.  Musculoskeletal:     Cervical back: Normal range of motion.     Comments: Right shoulder, elbow, wrist, digits- no skin wounds, coap+sugar tong in place, no instability, no blocks to motion  Sens  Ax/R/M/U intact  Mot   Ax/ R/ PIN/ M/ AIN/ U intact  Fingers perfused  RLE No traumatic wounds, ecchymosis, or rash  Long leg splint in place  Sens DPN, SPN, TN intact  Motor EHL grossly intact  Toes perfused, No significant edema  Skin:    General: Skin is warm and dry.  Neurological:     Mental Status: He is alert.  Psychiatric:        Mood and Affect: Mood normal.        Behavior: Behavior normal.    Assessment/Plan: Right humerus fx -- Plan ORIF today by Dr. Carola Frost. Please keep NPO. Right tibia fx -- Plan IMN today by Dr. Carola Frost. Right knee pain -- Will EUA, may need MRI Renal lac -- per trauma service Covid+    Freeman Caldron, PA-C Orthopedic Surgery [phone] 12/25/2020, 8:46 AM

## 2020-12-25 NOTE — ED Notes (Signed)
R leg reduced and splinted

## 2020-12-25 NOTE — ED Notes (Addendum)
Pt gave verbal consent for emergent reduction of R humerus and R tib/fib, unable to sign consent

## 2020-12-25 NOTE — Progress Notes (Signed)
Orthopedic Tech Progress Note Patient Details:  William Simon 09/06/1985 [id number] Level 2 trauma Ortho Devices Type of Ortho Device: Long arm splint, Short leg splint Ortho Device/Splint Location: RUE,RLE Ortho Device/Splint Interventions: Ordered, Application, Adjustment   Post Interventions Patient Tolerated: Other (comment) Instructions Provided: Other (comment)  Michelle Piper 12/25/2020, 4:46 AM

## 2020-12-25 NOTE — Anesthesia Preprocedure Evaluation (Addendum)
Anesthesia Evaluation  Patient identified by MRN, date of birth, ID band Patient awake    Reviewed: Allergy & Precautions, NPO status , Patient's Chart, lab work & pertinent test results  Airway Mallampati: I  TM Distance: >3 FB Neck ROM: Full    Dental  (+) Poor Dentition, Chipped, Dental Advisory Given,    Pulmonary neg pulmonary ROS, Current Smoker and Patient abstained from smoking.,  COVID positive, no symptoms   Pulmonary exam normal breath sounds clear to auscultation       Cardiovascular negative cardio ROS Normal cardiovascular exam Rhythm:Regular Rate:Normal     Neuro/Psych negative neurological ROS  negative psych ROS   GI/Hepatic negative GI ROS, (+)     substance abuse (on suboxone)  cocaine use,   Endo/Other  negative endocrine ROSIncidental adrenal mass: possible pheo  Renal/GU negative Renal ROS  negative genitourinary   Musculoskeletal negative musculoskeletal ROS (+) narcotic dependent  Abdominal   Peds  Hematology negative hematology ROS (+)   Anesthesia Other Findings Ped vs [facility] L pararenal hemorrhage L adrenal mass - no symptoms L humerus fx  L tib/fib fx  L navicular fx     Reproductive/Obstetrics                           Anesthesia Physical Anesthesia Plan  ASA: 3  Anesthesia Plan: General   Post-op Pain Management:    Induction: Intravenous and Rapid sequence  PONV Risk Score and Plan: 1 and Midazolam, Dexamethasone and Ondansetron  Airway Management Planned: Oral ETT  Additional Equipment:   Intra-op Plan:   Post-operative Plan: Extubation in OR  Informed Consent: I have reviewed the patients History and Physical, chart, labs and discussed the procedure including the risks, benefits and alternatives for the proposed anesthesia with the patient or authorized representative who has indicated his/her understanding and acceptance.     Dental  advisory given  Plan Discussed with: CRNA  Anesthesia Plan Comments:        Anesthesia Quick Evaluation

## 2020-12-25 NOTE — OR Nursing (Signed)
Called patient update to waiting room reception desk, ref. procedure still in progress and patient is tolerating procedure well thus far. (Patient stated his mother should be coming to hospital at some point).

## 2020-12-25 NOTE — Anesthesia Procedure Notes (Signed)
Procedure Name: Intubation Date/Time: 12/25/2020 3:48 PM Performed by: Fulton Reek, CRNA Pre-anesthesia Checklist: Patient identified, Emergency Drugs available, Suction available and Patient being monitored Patient Re-evaluated:Patient Re-evaluated prior to induction Oxygen Delivery Method: Circle System Utilized Preoxygenation: Pre-oxygenation with 100% oxygen Induction Type: IV induction and Rapid sequence Laryngoscope Size: Mac and 4 Grade View: Grade I Tube type: Oral Tube size: 7.5 mm Number of attempts: 1 Airway Equipment and Method: Stylet Placement Confirmation: ETT inserted through vocal cords under direct vision, positive ETCO2 and breath sounds checked- equal and bilateral Secured at: 22 cm Tube secured with: Tape Dental Injury: Teeth and Oropharynx as per pre-operative assessment
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
## 2020-12-25 NOTE — ED Notes (Signed)
Attempted to reach family, Loraine Leriche (stepdad) [phone], Bruna Potter (sister) [phone].

## 2020-12-25 NOTE — Transfer of Care (Signed)
Immediate Anesthesia Transfer of Care Note  Patient: William Simon  Procedure(s) Performed: OPEN REDUCTION INTERNAL FIXATION (ORIF) PROXIMAL HUMERUS FRACTURE (Right: Arm Upper) OPEN REDUCTION INTERNAL FIXATION TIBIA  and tibial plateau fractures  AND EXAM UNDER ANESTHESIA  KNEE (Right: Leg Lower)  Patient Location: PACU  Anesthesia Type:General  Level of Consciousness: drowsy  Airway & Oxygen Therapy: Patient Spontanous Breathing and Patient connected to nasal cannula oxygen  Post-op Assessment: Report given to RN and Post -op Vital signs reviewed and stable  Post vital signs: Reviewed and stable  Last Vitals:  Vitals Value Taken Time  BP 122/83 12/25/20 2033  Temp    Pulse 82 12/25/20 2039  Resp 12 12/25/20 2039  SpO2 100 % 12/25/20 2039  Vitals shown include unvalidated device data.  Last Pain:  Vitals:   12/25/20 1516  TempSrc: Oral  PainSc:          Complications: No notable events documented.

## 2020-12-25 NOTE — ED Notes (Signed)
Updated mother on the phone.  Pt speaking with mother presently.

## 2020-12-25 NOTE — ED Notes (Signed)
Trauma service at bedside.

## 2020-12-26 ENCOUNTER — Encounter (HOSPITAL_COMMUNITY): Payer: Self-pay

## 2020-12-26 ENCOUNTER — Inpatient Hospital Stay (HOSPITAL_COMMUNITY): Payer: Self-pay

## 2020-12-26 DIAGNOSIS — F1910 Other psychoactive substance abuse, uncomplicated: Secondary | ICD-10-CM | POA: Diagnosis present

## 2020-12-26 DIAGNOSIS — F17200 Nicotine dependence, unspecified, uncomplicated: Secondary | ICD-10-CM | POA: Diagnosis present

## 2020-12-26 DIAGNOSIS — S82101A Unspecified fracture of upper end of right tibia, initial encounter for closed fracture: Secondary | ICD-10-CM

## 2020-12-26 DIAGNOSIS — S82301A Unspecified fracture of lower end of right tibia, initial encounter for closed fracture: Secondary | ICD-10-CM

## 2020-12-26 HISTORY — DX: Unspecified fracture of upper end of right tibia, initial encounter for closed fracture: S82.101A

## 2020-12-26 HISTORY — DX: Other psychoactive substance abuse, uncomplicated: F19.10

## 2020-12-26 HISTORY — DX: Nicotine dependence, unspecified, uncomplicated: F17.200

## 2020-12-26 HISTORY — DX: Unspecified fracture of lower end of right tibia, initial encounter for closed fracture: S82.301A

## 2020-12-26 LAB — CBC
HCT: 18.8 % — ABNORMAL LOW (ref 39.0–52.0)
HCT: 28.8 % — ABNORMAL LOW (ref 39.0–52.0)
Hemoglobin: 6.1 g/dL — CL (ref 13.0–17.0)
Hemoglobin: 10 g/dL — ABNORMAL LOW (ref 13.0–17.0)
MCH: 29.8 pg (ref 26.0–34.0)
MCH: 30.4 pg (ref 26.0–34.0)
MCHC: 32.4 g/dL (ref 30.0–36.0)
MCHC: 34.7 g/dL (ref 30.0–36.0)
MCV: 91.7 fL (ref 80.0–100.0)
MCV: 87.5 fL (ref 80.0–100.0)
Platelets: 119 K/uL — ABNORMAL LOW (ref 150–400)
Platelets: 137 10*3/uL — ABNORMAL LOW (ref 150–400)
RBC: 2.05 MIL/uL — ABNORMAL LOW (ref 4.22–5.81)
RBC: 3.29 MIL/uL — ABNORMAL LOW (ref 4.22–5.81)
RDW: 13.2 % (ref 11.5–15.5)
RDW: 13.7 % (ref 11.5–15.5)
WBC: 4.5 K/uL (ref 4.0–10.5)
WBC: 5.4 10*3/uL (ref 4.0–10.5)
nRBC: 0 % (ref 0.0–0.2)
nRBC: 0 % (ref 0.0–0.2)

## 2020-12-26 LAB — BASIC METABOLIC PANEL
Anion gap: 8 (ref 5–15)
BUN: 7 mg/dL (ref 6–20)
CO2: 24 mmol/L (ref 22–32)
Calcium: 7.4 mg/dL — ABNORMAL LOW (ref 8.9–10.3)
Chloride: 104 mmol/L (ref 98–111)
Creatinine, Ser: 0.6 mg/dL — ABNORMAL LOW (ref 0.61–1.24)
GFR, Estimated: >60 mL/min (ref >60)
Glucose, Bld: 122 mg/dL — ABNORMAL HIGH (ref 70–99)
Potassium: 4.1 mmol/L (ref 3.5–5.1)
Sodium: 136 mmol/L (ref 135–145)

## 2020-12-26 LAB — VITAMIN D 25 HYDROXY (VIT D DEFICIENCY, FRACTURES): Vit D, 25-Hydroxy: 38.75 ng/mL (ref 30–100)

## 2020-12-26 LAB — PREPARE RBC (CROSSMATCH)

## 2020-12-26 MED ORDER — METHOCARBAMOL 750 MG PO TABS
750.0000 mg | ORAL_TABLET | Freq: Three times a day (TID) | ORAL | Status: DC
  Administered 2020-12-26 – 2021-01-04 (×27): 750 mg via ORAL
  Filled 2020-12-26 (×27): qty 1

## 2020-12-26 MED ORDER — CHLORHEXIDINE GLUCONATE CLOTH 2 % EX PADS
6.0000 | MEDICATED_PAD | Freq: Every day | CUTANEOUS | Status: DC
  Administered 2020-12-26 – 2021-01-04 (×6): 6 via TOPICAL

## 2020-12-26 MED ORDER — CEFAZOLIN SODIUM-DEXTROSE 2-4 GM/100ML-% IV SOLN
2.0000 g | Freq: Three times a day (TID) | INTRAVENOUS | Status: AC
  Administered 2020-12-26 (×3): 2 g via INTRAVENOUS
  Filled 2020-12-26 (×4): qty 100

## 2020-12-26 MED ORDER — SODIUM CHLORIDE 0.9% IV SOLUTION: Freq: Once | INTRAVENOUS | Status: DC

## 2020-12-26 MED ORDER — SODIUM CHLORIDE 0.9% IV SOLUTION: Freq: Once | INTRAVENOUS | Status: AC
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
## 2020-12-26 NOTE — Progress Notes (Signed)
Date and time results received: 12/26/20 n  (use smartphrase ".now" to insert current time)  Test: Hemoglobin  Critical Value: 6.1  Name of Provider Notified: Trauma MD; Dr Andrey Campanile  Orders Received? Or Actions Taken?:  Yes
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
## 2020-12-27 LAB — BASIC METABOLIC PANEL
Anion gap: 5 (ref 5–15)
BUN: 8 mg/dL (ref 6–20)
CO2: 27 mmol/L (ref 22–32)
Calcium: 7.7 mg/dL — ABNORMAL LOW (ref 8.9–10.3)
Chloride: 105 mmol/L (ref 98–111)
Creatinine, Ser: 0.62 mg/dL (ref 0.61–1.24)
GFR, Estimated: >60 mL/min (ref >60)
Glucose, Bld: 109 mg/dL — ABNORMAL HIGH (ref 70–99)
Potassium: 4.1 mmol/L (ref 3.5–5.1)
Sodium: 137 mmol/L (ref 135–145)

## 2020-12-27 LAB — TYPE AND SCREEN
ABO/RH(D): A POS
Antibody Screen: NEGATIVE
Unit division: 0
Unit division: 0

## 2020-12-27 LAB — CBC
HCT: 29 % — ABNORMAL LOW (ref 39.0–52.0)
Hemoglobin: 10 g/dL — ABNORMAL LOW (ref 13.0–17.0)
MCH: 30.3 pg (ref 26.0–34.0)
MCHC: 34.5 g/dL (ref 30.0–36.0)
MCV: 87.9 fL (ref 80.0–100.0)
Platelets: 134 10*3/uL — ABNORMAL LOW (ref 150–400)
RBC: 3.3 MIL/uL — ABNORMAL LOW (ref 4.22–5.81)
RDW: 13.9 % (ref 11.5–15.5)
WBC: 5 10*3/uL (ref 4.0–10.5)
nRBC: 0 % (ref 0.0–0.2)

## 2020-12-27 LAB — BPAM RBC
Blood Product Expiration Date: 202208032359
Blood Product Expiration Date: 202208072359
ISSUE DATE / TIME: 202207160817
ISSUE DATE / TIME: 202207161146
Unit Type and Rh: 6200
Unit Type and Rh: 6200

## 2020-12-27 MED ORDER — ENOXAPARIN SODIUM 30 MG/0.3ML IJ SOSY
30.0000 mg | PREFILLED_SYRINGE | Freq: Two times a day (BID) | INTRAMUSCULAR | Status: DC
  Administered 2020-12-28 – 2021-01-04 (×14): 30 mg via SUBCUTANEOUS
  Filled 2020-12-27 (×16): qty 0.3
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
## 2020-12-27 NOTE — Discharge Instructions (Addendum)
Orthopaedic Trauma Service Discharge Instructions   General Discharge Instructions  Orthopaedic Injuries:  Right tibial plateau fracture treated with open reduction internal fixation using plate and screws  Right distal tibia fracture treated with open reduction internal fixation using plate and screws  Right humerus fracture treated with open reduction internal fixation using plate and screws  WEIGHT BEARING STATUS: Weight-bear as tolerated right upper extremity to help with mobilizing.  Sling on for comfort.  Sling can be off when in bed or chair.  No lifting anything heavier than 5 pounds Nonweightbearing right lower extremity  RANGE OF MOTION/ACTIVITY: No active shoulder abduction.  Passive shoulder abduction okay.  Active and passive shoulder flexion extension.  Unrestricted range of motion of the elbow, forearm, wrist and hand.  Unrestricted range of motion of right knee.  You are unable to move your ankle on the right side as you are in a splint  Bone health: Labs show vitamin D deficiency.  Supplement with 5000 IUs vitamin D3 daily  Wound Care: Daily wound care to right arm.  4 x 4 gauze and tape.  Can leave wound open to air once it is dry.  Do not change dressing on right leg as you are splinted    Diet: as you were eating previously.  Can use over the counter stool softeners and bowel preparations, such as Miralax, to help with bowel movements.  Narcotics can be constipating.  Be sure to drink plenty of fluids  PAIN MEDICATION USE AND EXPECTATIONS  You have likely been given narcotic medications to help control your pain.  After a traumatic event that results in an fracture (broken bone) with or without surgery, it is ok to use narcotic pain medications to help control one's pain.  We understand that everyone responds to pain differently and each individual patient will be evaluated on a regular basis for the continued need for narcotic medications. Ideally, narcotic medication  use should last no more than 6-8 weeks (coinciding with fracture healing).   As a patient it is your responsibility as well to monitor narcotic medication use and report the amount and frequency you use these medications when you come to your office visit.   We would also advise that if you are using narcotic medications, you should take a dose prior to therapy to maximize you participation.  IF YOU ARE ON NARCOTIC MEDICATIONS IT IS NOT PERMISSIBLE TO OPERATE A MOTOR VEHICLE (MOTORCYCLE/CAR/TRUCK/MOPED) OR HEAVY MACHINERY DO NOT MIX NARCOTICS WITH OTHER CNS (CENTRAL NERVOUS SYSTEM) DEPRESSANTS SUCH AS ALCOHOL   POST-OPERATIVE OPIOID TAPER INSTRUCTIONS: It is important to wean off of your opioid medication as soon as possible. If you do not need pain medication after your surgery it is ok to stop day one. Opioids include: Codeine, Hydrocodone(Norco, Vicodin), Oxycodone(Percocet, oxycontin) and hydromorphone amongst others.  Long term and even short term use of opiods can cause: Increased pain response Dependence Constipation Depression Respiratory depression And more.  Withdrawal symptoms can include Flu like symptoms Nausea, vomiting And more Techniques to manage these symptoms Hydrate well Eat regular healthy meals Stay active Use relaxation techniques(deep breathing, meditating, yoga) Do Not substitute Alcohol to help with tapering If you have been on opioids for less than two weeks and do not have pain than it is ok to stop all together.  Plan to wean off of opioids This plan should start within one week post op of your fracture surgery  Maintain the same interval or time between taking each dose  and first decrease the dose.  Cut the total daily intake of opioids by one tablet each day Next start to increase the time between doses. The last dose that should be eliminated is the evening dose.    STOP SMOKING OR USING NICOTINE PRODUCTS!!!!  As discussed nicotine severely  impairs your body's ability to heal surgical and traumatic wounds but also impairs bone healing.  Wounds and bone heal by forming microscopic blood vessels (angiogenesis) and nicotine is a vasoconstrictor (essentially, shrinks blood vessels).  Therefore, if vasoconstriction occurs to these microscopic blood vessels they essentially disappear and are unable to deliver necessary nutrients to the healing tissue.  This is one modifiable factor that you can do to dramatically increase your chances of healing your injury.    (This means no smoking, no nicotine gum, patches, etc)  DO NOT USE NONSTEROIDAL ANTI-INFLAMMATORY DRUGS (NSAID'S)  Using products such as Advil (ibuprofen), Aleve (naproxen), Motrin (ibuprofen) for additional pain control during fracture healing can delay and/or prevent the healing response.  If you would like to take over the counter (OTC) medication, Tylenol (acetaminophen) is ok.  However, some narcotic medications that are given for pain control contain acetaminophen as well. Therefore, you should not exceed more than 4000 mg of tylenol in a day if you do not have liver disease.  Also note that there are may OTC medicines, such as cold medicines and allergy medicines that my contain tylenol as well.  If you have any questions about medications and/or interactions please ask your doctor/PA or your pharmacist.      ICE AND ELEVATE INJURED/OPERATIVE EXTREMITY  Using ice and elevating the injured extremity above your heart can help with swelling and pain control.  Icing in a pulsatile fashion, such as 20 minutes on and 20 minutes off, can be followed.    Do not place ice directly on skin. Make sure there is a barrier between to skin and the ice pack.    Using frozen items such as frozen peas works well as the conform nicely to the are that needs to be iced.  USE AN ACE WRAP OR TED HOSE FOR SWELLING CONTROL  In addition to icing and elevation, Ace wraps or TED hose are used to help limit  and resolve swelling.  It is recommended to use Ace wraps or TED hose until you are informed to stop.    When using Ace Wraps start the wrapping distally (farthest away from the body) and wrap proximally (closer to the body)   Example: If you had surgery on your leg or thing and you do not have a splint on, start the ace wrap at the toes and work your way up to the thigh        If you had surgery on your upper extremity and do not have a splint on, start the ace wrap at your fingers and work your way up to the upper arm  IF YOU ARE IN A SPLINT OR CAST DO NOT REMOVE IT FOR ANY REASON   If your splint gets wet for any reason please contact the office immediately. You may shower in your splint or cast as long as you keep it dry.  This can be done by wrapping in a cast cover or garbage back (or similar)  Do Not stick any thing down your splint or cast such as pencils, money, or hangers to try and scratch yourself with.  If you feel itchy take benadryl as prescribed on the  bottle for itching  IF YOU ARE IN A CAM BOOT (BLACK BOOT)  You may remove boot periodically. Perform daily dressing changes as noted below.  Wash the liner of the boot regularly and wear a sock when wearing the boot. It is recommended that you sleep in the boot until told otherwise    Call office for the following: Temperature greater than 101F Persistent nausea and vomiting Severe uncontrolled pain Redness, tenderness, or signs of infection (pain, swelling, redness, odor or green/yellow discharge around the site) Difficulty breathing, headache or visual disturbances Hives Persistent dizziness or light-headedness Extreme fatigue Any other questions or concerns you may have after discharge  In an emergency, call 911 or go to an Emergency Department at a nearby hospital  HELPFUL INFORMATION  If you had a block, it will wear off between 8-24 hrs postop typically.  This is period when your pain may go from nearly zero to the  pain you would have had postop without the block.  This is an abrupt transition but nothing dangerous is happening.  You may take an extra dose of narcotic when this happens.  You should wean off your narcotic medicines as soon as you are able.  Most patients will be off or using minimal narcotics before their first postop appointment.   We suggest you use the pain medication the first night prior to going to bed, in order to ease any pain when the anesthesia wears off. You should avoid taking pain medications on an empty stomach as it will make you nauseous.  Do not drink alcoholic beverages or take illicit drugs when taking pain medications.  In most states it is against the law to drive while you are in a splint or sling.  And certainly against the law to drive while taking narcotics.  You may return to work/school in the next couple of days when you feel up to it.   Pain medication may make you constipated.  Below are a few solutions to try in this order: Decrease the amount of pain medication if you aren't having pain. Drink lots of decaffeinated fluids. Drink prune juice and/or each dried prunes  If the first 3 don't work start with additional solutions Take Colace - an over-the-counter stool softener Take Senokot - an over-the-counter laxative Take Miralax - a stronger over-the-counter laxative     CALL THE OFFICE WITH ANY QUESTIONS OR CONCERNS: [phone]   VISIT OUR WEBSITE FOR ADDITIONAL INFORMATION: [email or web address]
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
## 2020-12-27 NOTE — Progress Notes (Signed)
Orthopedic Tech Progress Note Patient Details:  William Simon [birth year] [id number] Called [facility] for knee brace. Applied bone foam.  Ortho Devices Type of Ortho Device: Bone foam zero knee Ortho Device/Splint Location: RLE Ortho Device/Splint Interventions: Ordered, Application   Post Interventions Patient Tolerated: Well Instructions Provided: Other (comment)  Montel Clock D William Simon 12/27/2020, 3:36 PM
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
## 2020-12-27 NOTE — Progress Notes (Signed)
Patient ID: William Simon, male   DOB: Nov 04, 1985, 35 y.o.   MRN: [id number] [facility] Surgery Progress Note:   2 Days Post-Op  Subjective: Mental status is clear and alert.  Complaints none. Objective: Vital signs in last 24 hours: Temp:  [98.7 F (37.1 C)-99.6 F (37.6 C)] 98.9 F (37.2 C) (07/17 0907) Pulse Rate:  [65-84] 72 (07/17 1134) Resp:  [11-15] 12 (07/17 1134) BP: (106-127)/(71-89) 111/73 (07/17 1134) SpO2:  [97 %-100 %] 100 % (07/17 1134)  Intake/Output from previous day: 07/16 0701 - 07/17 0700 In: 2798.4 [P.O.:480; I.V.:1222.1; Blood:896.3; IV Piggyback:200] Out: 1500 [Urine:1500] Intake/Output this shift: No intake/output data recorded.  Physical Exam: Work of breathing is OK--no symptoms of Covid.    Lab Results:  Results for orders placed or performed during the hospital encounter of 12/25/20 (from the past 48 hour(s))  CBC     Status: Abnormal   Collection Time: 12/26/20  5:25 AM  Result Value Ref Range   WBC 4.5 4.0 - 10.5 K/uL   RBC 2.05 (L) 4.22 - 5.81 MIL/uL   Hemoglobin 6.1 (LL) 13.0 - 17.0 g/dL    Comment: REPEATED TO VERIFY THIS CRITICAL RESULT HAS VERIFIED AND BEEN CALLED TO GLADYS REBKANLY,RN BY ZELDA BEECH ON 07 16 2022 AT 0612, AND HAS BEEN READ BACK.     HCT 18.8 (L) 39.0 - 52.0 %   MCV 91.7 80.0 - 100.0 fL   MCH 29.8 26.0 - 34.0 pg   MCHC 32.4 30.0 - 36.0 g/dL   RDW 69.4 85.4 - 62.7 %   Platelets 119 (L) 150 - 400 K/uL   nRBC 0.0 0.0 - 0.2 %    Comment: Performed at [facility], [address] [place], [place] [address]  Basic metabolic panel     Status: Abnormal   Collection Time: 12/26/20  5:25 AM  Result Value Ref Range   Sodium 136 135 - 145 mmol/L   Potassium 4.1 3.5 - 5.1 mmol/L   Chloride 104 98 - 111 mmol/L   CO2 24 22 - 32 mmol/L   Glucose, Bld 122 (H) 70 - 99 mg/dL    Comment: Glucose reference range applies only to samples taken after fasting for at least 8 hours.   BUN 7 6 - 20 mg/dL   Creatinine, Ser 9.38 (L)  0.61 - 1.24 mg/dL   Calcium 7.4 (L) 8.9 - 10.3 mg/dL   GFR, Estimated >18 >29 mL/min    Comment: (NOTE) Calculated using the CKD-EPI Creatinine Equation (2021)    Anion gap 8 5 - 15    Comment: Performed at [facility], [address], [place], [place] [address]  VITAMIN D 25 Hydroxy (Vit-D Deficiency, Fractures)     Status: None   Collection Time: 12/26/20  5:25 AM  Result Value Ref Range   Vit D, 25-Hydroxy 38.75 30 - 100 ng/mL    Comment: (NOTE) Vitamin D deficiency has been defined by the Institute of Medicine  and an Endocrine Society practice guideline as a level of serum 25-OH  vitamin D less than 20 ng/mL (1,2). The Endocrine Society went on to  further define vitamin D insufficiency as a level between 21 and 29  ng/mL (2).  1. IOM (Institute of Medicine). 2010. Dietary reference intakes for  calcium and D. [place]: [employer]. 2. Holick MF, Binkley Yampa, Bischoff-Ferrari HA, et al. Evaluation,  treatment, and prevention of vitamin D deficiency: an Endocrine  Society clinical  practice guideline, JCEM. 2011 Jul; 96(7): 1911-30.  Performed at [facility] Lab, [address] [place], [place] [address]   Prepare RBC (crossmatch)     Status: None   Collection Time: 12/26/20  7:00 AM  Result Value Ref Range   Order Confirmation      ORDER PROCESSED BY BLOOD BANK Performed at [facility], [address], [place], [place] [address]   CBC     Status: Abnormal   Collection Time: 12/26/20  7:57 PM  Result Value Ref Range   WBC 5.4 4.0 - 10.5 K/uL   RBC 3.29 (L) 4.22 - 5.81 MIL/uL   Hemoglobin 10.0 (L) 13.0 - 17.0 g/dL    Comment: REPEATED TO VERIFY POST TRANSFUSION SPECIMEN DELTA CHECK NOTED    HCT 28.8 (L) 39.0 - 52.0 %   MCV 87.5 80.0 - 100.0 fL   MCH 30.4 26.0 - 34.0 pg   MCHC 34.7 30.0 - 36.0 g/dL   RDW 76.5 46.5 - 03.5 %   Platelets 137 (L) 150 - 400 K/uL   nRBC 0.0 0.0 - 0.2 %    Comment: Performed at [facility]  Lab, [address] [place], [place] [address]  CBC     Status: Abnormal   Collection Time: 12/27/20  6:59 AM  Result Value Ref Range   WBC 5.0 4.0 - 10.5 K/uL   RBC 3.30 (L) 4.22 - 5.81 MIL/uL   Hemoglobin 10.0 (L) 13.0 - 17.0 g/dL   HCT 12.7 (L) 51.7 - 00.1 %   MCV 87.9 80.0 - 100.0 fL   MCH 30.3 26.0 - 34.0 pg   MCHC 34.5 30.0 - 36.0 g/dL   RDW 74.9 44.9 - 67.5 %   Platelets 134 (L) 150 - 400 K/uL   nRBC 0.0 0.0 - 0.2 %    Comment: Performed at [facility], [address] [place], [place] [address]  Basic metabolic panel     Status: Abnormal   Collection Time: 12/27/20  6:59 AM  Result Value Ref Range   Sodium 137 135 - 145 mmol/L   Potassium 4.1 3.5 - 5.1 mmol/L   Chloride 105 98 - 111 mmol/L   CO2 27 22 - 32 mmol/L   Glucose, Bld 109 (H) 70 - 99 mg/dL    Comment: Glucose reference range applies only to samples taken after fasting for at least 8 hours.   BUN 8 6 - 20 mg/dL   Creatinine, Ser 4.66 0.61 - 1.24 mg/dL   Calcium 7.7 (L) 8.9 - 10.3 mg/dL   GFR, Estimated >59 >93 mL/min    Comment: (NOTE) Calculated using the CKD-EPI Creatinine Equation (2021)    Anion gap 5 5 - 15    Comment: Performed at [facility], [address] [place], [place] [address]    Radiology/Results: DG Tibia/Fibula Right  Result Date: 12/25/2020 CLINICAL DATA:  ORIF right tibia EXAM: DG C-ARM 1-60 MIN; RIGHT TIBIA AND FIBULA - 2 VIEW FLUOROSCOPY TIME:  Fluoroscopy Time:  64 seconds Radiation Exposure Index (if provided by the fluoroscopic device): 2.5 mGy Number of Acquired Spot Images: 17 COMPARISON:  12/25/2020 FINDINGS: 17 fluoroscopic images are obtained during the performance of the procedure and are provided for interpretation only. Images demonstrate lateral plate and screw fixation traversing the right tibial plateau fracture seen previously. Alignment is anatomic. Medial plate and screw fixation is also identified traversing the distal tibial fracture, with resulting anatomic  alignment. There is  reduction of the proximal fibular fracture seen previously with improved near anatomic alignment. IMPRESSION: 1. ORIF of the right tibia as above. 2. Reduction of the fibular fracture. Electronically Signed   By: Sharlet SalinaMichael  Brown M.D.   On: 12/25/2020 22:04   DG Ankle Complete Right  Result Date: 12/26/2020 CLINICAL DATA:  Status post right lower leg open reduction internal fixation. EXAM: RIGHT ANKLE - COMPLETE 3+ VIEW COMPARISON:  December 25, 2020 FINDINGS: Interval open reduction internal fixation of severely displaced right tibial fracture with improved alignment at the fracture line. Sideplate and screw fixation without evidence of immediate complications. IMPRESSION: Status post open reduction internal fixation of severely displaced right tibial fracture. Electronically Signed   By: Ted Mcalpineobrinka  Dimitrova M.D.   On: 12/26/2020 11:50   DG Knee Right Port  Result Date: 12/26/2020 CLINICAL DATA:  35 year old male status post ORIF in the right lower leg. EXAM: PORTABLE RIGHT KNEE - 1-2 VIEW COMPARISON:  Right knee radiograph 12/25/2020. FINDINGS: Status post reduction of previously noted obliquely oriented fracture in the proximal metadiaphyseal region of the tibia. There is now a lateral plate and screw fixation device traversing the fracture which appears appropriately located. Displaced transverse fracture of the mid fibular diaphysis with approximately 1 and half shaft of medial displacement is incompletely imaged. Visualized portions of the femur and the patella appear intact. Proximal aspect of an additional plate and screw fixation device in the lower tibia is incompletely imaged. IMPRESSION: 1. Status post ORIF in the proximal right tibia, as above. 2. Displaced mid fibular diaphyseal fracture again noted. Electronically Signed   By: Trudie Reedaniel  Entrikin M.D.   On: 12/26/2020 11:51   DG Tibia/Fibula Right Port  Result Date: 12/26/2020 CLINICAL DATA:  Status post open reduction internal  fixation. EXAM: PORTABLE RIGHT TIBIA AND FIBULA - 2 VIEW COMPARISON:  December 25, 2020 FINDINGS: Post open reduction internal fixation severe displaced proximal and distal right tibial fracture. Normal alignment of the orthopedic hardware. No evidence of immediate complications. Improved alignment. IMPRESSION: Post open reduction internal fixation of severely displaced proximal and distal right tibial fracture. Electronically Signed   By: Ted Mcalpineobrinka  Dimitrova M.D.   On: 12/26/2020 11:51   DG Humerus Right  Result Date: 12/25/2020 CLINICAL DATA:  Right humerus ORIF EXAM: RIGHT HUMERUS - 2+ VIEW COMPARISON:  12/25/2020 FINDINGS: Five fluoroscopic images are obtained during the performance of procedure and are provided for interpretation only. Images demonstrate plate and screw fixation traversing the distal humeral fracture, with anatomic alignment. Please refer to the operative report for full description of findings. IMPRESSION: 1. ORIF right humerus with anatomic alignment. Electronically Signed   By: Sharlet SalinaMichael  Brown M.D.   On: 12/25/2020 21:55   DG C-Arm 1-60 Min  Result Date: 12/25/2020 CLINICAL DATA:  ORIF right tibia EXAM: DG C-ARM 1-60 MIN; RIGHT TIBIA AND FIBULA - 2 VIEW FLUOROSCOPY TIME:  Fluoroscopy Time:  64 seconds Radiation Exposure Index (if provided by the fluoroscopic device): 2.5 mGy Number of Acquired Spot Images: 17 COMPARISON:  12/25/2020 FINDINGS: 17 fluoroscopic images are obtained during the performance of the procedure and are provided for interpretation only. Images demonstrate lateral plate and screw fixation traversing the right tibial plateau fracture seen previously. Alignment is anatomic. Medial plate and screw fixation is also identified traversing the distal tibial fracture, with resulting anatomic alignment. There is reduction of the proximal fibular fracture seen previously with improved near anatomic alignment. IMPRESSION: 1. ORIF of the right tibia as above. 2. Reduction of the  fibular  fracture. Electronically Signed   By: Sharlet Salina M.D.   On: 12/25/2020 22:04    Anti-infectives: Anti-infectives (From admission, onward)    Start     Dose/Rate Route Frequency Ordered Stop   12/26/20 0715  ceFAZolin (ANCEF) IVPB 2g/100 mL premix        2 g 200 mL/hr over 30 Minutes Intravenous Every 8 hours 12/26/20 0626 12/26/20 2306       Assessment/Plan: Problem List: Patient Active Problem List   Diagnosis Date Noted   Closed fracture of right proximal tibia 12/26/2020   Closed fracture of right distal tibia 12/26/2020   Nicotine dependence 12/26/2020   Polysubstance abuse (HCC) 12/26/2020   Fracture of humeral shaft, right, closed 12/25/2020    Orthopedic trauma.  Stablized and doing OK 2 Days Post-Op    LOS: 2 days   Matt B. Daphine Deutscher, MD, [facility] [phone] to reach the surgeon on call.    12/27/2020 12:40 PM

## 2020-12-28 ENCOUNTER — Encounter (HOSPITAL_COMMUNITY): Payer: Self-pay | Admitting: Orthopedic Surgery

## 2020-12-28 LAB — BASIC METABOLIC PANEL
Anion gap: 5 (ref 5–15)
BUN: 10 mg/dL (ref 6–20)
CO2: 30 mmol/L (ref 22–32)
Calcium: 7.9 mg/dL — ABNORMAL LOW (ref 8.9–10.3)
Chloride: 104 mmol/L (ref 98–111)
Creatinine, Ser: 0.59 mg/dL — ABNORMAL LOW (ref 0.61–1.24)
GFR, Estimated: >60 mL/min (ref >60)
Glucose, Bld: 107 mg/dL — ABNORMAL HIGH (ref 70–99)
Potassium: 4.1 mmol/L (ref 3.5–5.1)
Sodium: 139 mmol/L (ref 135–145)

## 2020-12-28 LAB — CBC
HCT: 29.9 % — ABNORMAL LOW (ref 39.0–52.0)
Hemoglobin: 10 g/dL — ABNORMAL LOW (ref 13.0–17.0)
MCH: 30 pg (ref 26.0–34.0)
MCHC: 33.4 g/dL (ref 30.0–36.0)
MCV: 89.8 fL (ref 80.0–100.0)
Platelets: 183 10*3/uL (ref 150–400)
RBC: 3.33 MIL/uL — ABNORMAL LOW (ref 4.22–5.81)
RDW: 13.7 % (ref 11.5–15.5)
WBC: 5.6 10*3/uL (ref 4.0–10.5)
nRBC: 0 % (ref 0.0–0.2)

## 2020-12-28 MED ORDER — NICOTINE 14 MG/24HR TD PT24
14.0000 mg | MEDICATED_PATCH | Freq: Every day | TRANSDERMAL | Status: DC
  Administered 2020-12-28 – 2021-01-04 (×6): 14 mg via TRANSDERMAL
  Filled 2020-12-28 (×8): qty 1

## 2020-12-28 NOTE — Progress Notes (Signed)
Inpatient Rehab Admissions Coordinator:   CIR consult received.  Note that Pt. Is currently on airborne/contact precautions for COVID. Marland Kitchen Pt. Patients are eligible to be considered for admit to the [facility] when cleared from airborne precautions by Acute MD, or Infectious Disease MD.  Otherwise, they will need to be >20 days from their positive test with recovery/improvement in symptoms or 2 negative tests. Will follow for potential admission one off precautions.   Megan Salon, MS, CCC-SLP Rehab Admissions Coordinator  [phone] (celll) [phone] (office)

## 2020-12-28 NOTE — Progress Notes (Signed)
Inpatient Rehab Admissions Coordinator Note:   Per PT recommendations, pt was screened for CIR candidacy by Estill Dooms, PT, DPT.  At this time we are recommending a CIR consult and I will place an order per our protocol.  Please contact me with questions.   Estill Dooms, PT, DPT [phone] 12/28/20 11:18 AM
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
## 2020-12-28 NOTE — Plan of Care (Signed)
  Problem: Education: Goal: Required Educational Video(s) Outcome: Progressing   Problem: Clinical Measurements: Goal: Ability to maintain clinical measurements within normal limits will improve Outcome: Progressing Goal: Postoperative complications will be avoided or minimized Outcome: Progressing   
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
## 2020-12-29 LAB — CBC
HCT: 28.1 % — ABNORMAL LOW (ref 39.0–52.0)
Hemoglobin: 9.5 g/dL — ABNORMAL LOW (ref 13.0–17.0)
MCH: 30.3 pg (ref 26.0–34.0)
MCHC: 33.8 g/dL (ref 30.0–36.0)
MCV: 89.5 fL (ref 80.0–100.0)
Platelets: 276 10*3/uL (ref 150–400)
RBC: 3.14 MIL/uL — ABNORMAL LOW (ref 4.22–5.81)
RDW: 13.3 % (ref 11.5–15.5)
WBC: 6.8 10*3/uL (ref 4.0–10.5)
nRBC: 0 % (ref 0.0–0.2)

## 2020-12-29 LAB — BASIC METABOLIC PANEL
Anion gap: 7 (ref 5–15)
BUN: 5 mg/dL — ABNORMAL LOW (ref 6–20)
CO2: 28 mmol/L (ref 22–32)
Calcium: 7.9 mg/dL — ABNORMAL LOW (ref 8.9–10.3)
Chloride: 102 mmol/L (ref 98–111)
Creatinine, Ser: 0.55 mg/dL — ABNORMAL LOW (ref 0.61–1.24)
GFR, Estimated: >60 mL/min (ref >60)
Glucose, Bld: 122 mg/dL — ABNORMAL HIGH (ref 70–99)
Potassium: 3.7 mmol/L (ref 3.5–5.1)
Sodium: 137 mmol/L (ref 135–145)

## 2020-12-29 NOTE — Progress Notes (Signed)
Progress Note  4 Days Post-Op  Subjective: CC: continued but stable pain well controlled with pain medications. Tolerating diet. Voiding well following foley removal yesterday. No respiratory complaints   Objective: Vital signs in last 24 hours: Temp:  [97.8 F (36.6 C)-99 F (37.2 C)] 98.2 F (36.8 C) (07/19 0802) Pulse Rate:  [65-103] 80 (07/19 0802) Resp:  [6-21] 12 (07/19 0802) BP: (108-133)/(81-91) 133/88 (07/19 0802) SpO2:  [86 %-100 %] 98 % (07/19 0802) Last BM Date: 12/25/20  Intake/Output from previous day: 07/18 0701 - 07/19 0700 In: 2345.5 [P.O.:1250; I.V.:1095.5] Out: 2550 [Urine:2550] Intake/Output this shift: Total I/O In: 400 [P.O.:400] Out: -   PE: General: pleasant, WD, male who is sitting up in chair in NAD HEENT: head is normocephalic, atraumatic.  Mouth is pink and moist Heart: regular, rate, and rhythm. Palpable radial pulses bilaterally Lungs: CTAB, no wheezes, rhonchi, or rales noted.  Respiratory effort nonlabored Abd: soft, NT, ND MS: right upper extremity with expected swelling, dressing c/d/I, fingers with sensation and mobility intact. RLE with knee immobilizer in place, toes with sensation intact and WWP, ace wrap c/d/i Skin: warm and dry with no masses, lesions, or rashes Psych: A&Ox3 with an appropriate affect.    Lab Results:  Recent Labs    12/28/20 0218 12/29/20 0228  WBC 5.6 6.8  HGB 10.0* 9.5*  HCT 29.9* 28.1*  PLT 183 276    BMET Recent Labs    12/28/20 0218 12/29/20 0228  NA 139 137  K 4.1 3.7  CL 104 102  CO2 30 28  GLUCOSE 107* 122*  BUN 10 5*  CREATININE 0.59* 0.55*  CALCIUM 7.9* 7.9*    PT/INR No results for input(s): LABPROT, INR in the last 72 hours. CMP     Component Value Date/Time   NA 137 12/29/2020 0228   K 3.7 12/29/2020 0228   CL 102 12/29/2020 0228   CO2 28 12/29/2020 0228   GLUCOSE 122 (H) 12/29/2020 0228   BUN 5 (L) 12/29/2020 0228   CREATININE 0.55 (L) 12/29/2020 0228   CALCIUM 7.9  (L) 12/29/2020 0228   PROT 5.6 (L) 12/25/2020 0330   ALBUMIN 3.3 (L) 12/25/2020 0330   AST 33 12/25/2020 0330   ALT 64 (H) 12/25/2020 0330   ALKPHOS 83 12/25/2020 0330   BILITOT 0.5 12/25/2020 0330   GFRNONAA >60 12/29/2020 0228   Lipase  No results found for: LIPASE     Studies/Results: No results found.  Anti-infectives: Anti-infectives (From admission, onward)    Start     Dose/Rate Route Frequency Ordered Stop   12/26/20 0715  ceFAZolin (ANCEF) IVPB 2g/100 mL premix        2 g 200 mL/hr over 30 Minutes Intravenous Every 8 hours 12/26/20 0626 12/26/20 2306        Assessment/Plan  Ped vs [facility] COVID - asymptomatic.  No tx needed.  Monitor L pararenal hemorrhage - no direct injury or laceration to the kidney.  hemoglobin stable - 9.5 s/p 2 units prbc. No back or abdominal pain.  UA negative for blood. 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  urine metanephrines ordered and in process of cllection R humerus fx - per ortho. S/p ORIF Dr. Carola Frost 7/17. Can weight bear with platform walker but lifting restriction of 5 lbs R tib/fib fx (tibial plateau)- per ortho. S/p ORIF Dr. Carola Frost 7/17. Nonweightbearing per ortho. Hinged knee brace R navicular fx - per ortho Abrasions - local wound care Tobacco abuse - nicotine  patch Intermittent history of suboxone use - off the street, not prescribed UDS positive for amphetamines. He denies use  FEN - regular, IV LR to 50 ml/hr VTE - lovenox ID - cefazolin periop Foley - d/c'd 7/18  Disposition - 4NP, PT/OT - recommending CIR which is complicated by positive COVID    LOS: 4 days    Eric Form, [facility] 12/29/2020, 10:17 AM Please see Amion for pager number during day hours 7:00am-4:30pm
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
## 2020-12-30 LAB — BASIC METABOLIC PANEL
Anion gap: 4 — ABNORMAL LOW (ref 5–15)
BUN: 8 mg/dL (ref 6–20)
CO2: 29 mmol/L (ref 22–32)
Calcium: 8 mg/dL — ABNORMAL LOW (ref 8.9–10.3)
Chloride: 105 mmol/L (ref 98–111)
Creatinine, Ser: 0.55 mg/dL — ABNORMAL LOW (ref 0.61–1.24)
GFR, Estimated: >60 mL/min (ref >60)
Glucose, Bld: 110 mg/dL — ABNORMAL HIGH (ref 70–99)
Potassium: 3.9 mmol/L (ref 3.5–5.1)
Sodium: 138 mmol/L (ref 135–145)

## 2020-12-30 LAB — CBC
HCT: 25.1 % — ABNORMAL LOW (ref 39.0–52.0)
Hemoglobin: 8.6 g/dL — ABNORMAL LOW (ref 13.0–17.0)
MCH: 30.4 pg (ref 26.0–34.0)
MCHC: 34.3 g/dL (ref 30.0–36.0)
MCV: 88.7 fL (ref 80.0–100.0)
Platelets: 305 10*3/uL (ref 150–400)
RBC: 2.83 MIL/uL — ABNORMAL LOW (ref 4.22–5.81)
RDW: 13.1 % (ref 11.5–15.5)
WBC: 6.3 10*3/uL (ref 4.0–10.5)
nRBC: 0 % (ref 0.0–0.2)

## 2020-12-30 MED ORDER — POLYETHYLENE GLYCOL 3350 17 G PO PACK
17.0000 g | PACK | Freq: Every day | ORAL | Status: DC | PRN
  Filled 2020-12-30: qty 1
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
## 2020-12-30 NOTE — TOC CAGE-AID Note (Signed)
Transition of Care [facility]) - CAGE-AID Screening   Patient Details  Name: William Simon MRN: [id number] Date of Birth: 23-Nov-1985  [facility] CM/SW Contact:    Elianis Fischbach [name], LCSWA Phone Number: 12/30/2020, 2:12 PM   Clinical Narrative: Pt participated in Cage-Aid.  Pt stated he does use substance, not ETOH.  Pt was offered resources.  Pt stated they would accept resources.  CSW will attach resources to dc summary for future use.  William Simon, MSW, LCSW-A Pronouns:  She/Her/Hers [facility] Clinical Social Worker Direct Number:  [phone] [name]   CAGE-AID Screening:    Have You Ever Felt You Ought to Cut Down on Your Drinking or Drug Use?: Yes Have People Annoyed You By Critizing Your Drinking Or Drug Use?: No Have You Felt Bad Or Guilty About Your Drinking Or Drug Use?: No Have You Ever Had a Drink or Used Drugs First Thing In The Morning to Steady Your Nerves or to Get Rid of a Hangover?: No CAGE-AID Score: 1  Substance Abuse Education Offered: Yes  Substance abuse interventions: Transport planner

## 2020-12-30 NOTE — Progress Notes (Signed)
Patient will be appropriate for discharge to CIR soon however this is complicated by positive COVID test. Reached out to infection prevention to discuss guidelines and timeline for lifting precautions. Per current guidelines as patient is asymptomatic and not immunocompromised he will need to remain under isolation for 10 days from positive test. His first date off isolation would be Monday 7/25. I will update CIR. Will continue to follow patient's progress with PT/OT.
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
## 2020-12-30 NOTE — Progress Notes (Signed)
Inpatient Rehab Admissions Coordinator:   I spoke with Pt. Regarding potential CIR admit. He stated interest and that he would discharge home with his mother who could provide 24/7 assist. However, I spoke with Pt.'s mother and she states she is not able to provide physical assistance and that she and her husband cannot take Pt. Into their home due to history of drug abuse. CIR will sign off at this time but MD may re-consult if an alternative dispo is identified. Pt. Was notified and stated understanding.   Megan Salon, MS, CCC-SLP Rehab Admissions Coordinator  [phone] (celll) [phone] (office)

## 2020-12-31 LAB — METANEPHRINES, URINE, 24 HOUR
Metaneph Total, Ur: 101 ug/L
Metanephrines, 24H Ur: 152 ug/24 hr (ref 58–276)
Normetanephrine, 24H Ur: 722 ug/24 hr (ref 156–729)
Normetanephrine, Ur: 481 ug/L
Total Volume: 1500

## 2020-12-31 LAB — CBC
HCT: 25.2 % — ABNORMAL LOW (ref 39.0–52.0)
Hemoglobin: 8.5 g/dL — ABNORMAL LOW (ref 13.0–17.0)
MCH: 30.2 pg (ref 26.0–34.0)
MCHC: 33.7 g/dL (ref 30.0–36.0)
MCV: 89.7 fL (ref 80.0–100.0)
Platelets: 425 10*3/uL — ABNORMAL HIGH (ref 150–400)
RBC: 2.81 MIL/uL — ABNORMAL LOW (ref 4.22–5.81)
RDW: 13 % (ref 11.5–15.5)
WBC: 7.9 10*3/uL (ref 4.0–10.5)
nRBC: 0 % (ref 0.0–0.2)

## 2020-12-31 NOTE — TOC Progression Note (Signed)
Transition of Care [facility]) - Progression Note    Patient Details  Name: William Simon MRN: [id number] Date of Birth: Jan 02, 1986  [facility]) CM/SW Contact  Astrid Drafts Berna Spare, RN Phone Number: 12/31/2020, 1:40pm Clinical Narrative:   CIR has declined patient due to lack of caregiver support at discharge.  Patient states that his mother and stepfather are not able to care for him at discharge; he states that he has not been able to call any friends yet, as his mother has not brought him his phone. Spoke with patient's mother, per her request; she inquires about potential placement options for patient.  Patient will be difficult to place in SNF for rehab due to no insurance and drug use history, and explained this to mother.  Hopeful that patient will be able to secure some support, possibly from friends; if this occurs, we will ask inpatient rehab to re-evaluate.     Expected Discharge Plan: IP Rehab Facility Barriers to Discharge: Continued Medical Work up  Expected Discharge Plan and Services Expected Discharge Plan: IP Rehab Facility   Discharge Planning Services: CM Consult   Living arrangements for the past 2 months: Single Family Home                                       Social Determinants of Health (SDOH) Interventions    Readmission Risk Interventions No flowsheet data found.  Quintella Baton, RN, BSN  Trauma/Neuro ICU Case Manager [phone]
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
## 2021-01-01 MED ORDER — BISACODYL 10 MG RE SUPP
10.0000 mg | Freq: Every day | RECTAL | Status: DC | PRN
  Administered 2021-01-02: 10 mg via RECTAL
  Filled 2021-01-01: qty 1

## 2021-01-01 MED ORDER — DIPHENHYDRAMINE HCL 25 MG PO CAPS
25.0000 mg | ORAL_CAPSULE | Freq: Four times a day (QID) | ORAL | Status: DC | PRN
  Administered 2021-01-02 (×2): 25 mg via ORAL
  Filled 2021-01-01 (×2): qty 1

## 2021-01-01 MED ORDER — SENNOSIDES-DOCUSATE SODIUM 8.6-50 MG PO TABS
1.0000 | ORAL_TABLET | Freq: Every day | ORAL | Status: DC
  Administered 2021-01-01 – 2021-01-03 (×3): 1 via ORAL
  Filled 2021-01-01 (×3): qty 1

## 2021-01-01 MED ORDER — MELATONIN 3 MG PO TABS
3.0000 mg | ORAL_TABLET | Freq: Every day | ORAL | Status: DC
  Administered 2021-01-01 – 2021-01-03 (×3): 3 mg via ORAL
  Filled 2021-01-01 (×3): qty 1

## 2021-01-01 MED ORDER — POLYETHYLENE GLYCOL 3350 17 G PO PACK
17.0000 g | PACK | Freq: Every day | ORAL | Status: DC
  Administered 2021-01-01 – 2021-01-02 (×2): 17 g via ORAL
  Filled 2021-01-01 (×4): qty 1

## 2021-01-01 MED ORDER — SENNOSIDES-DOCUSATE SODIUM 8.6-50 MG PO TABS: 1.0000 | ORAL_TABLET | Freq: Every day | ORAL | Status: DC
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
## 2021-01-01 NOTE — Progress Notes (Addendum)
Progress Note  7 Days Post-Op  Subjective: CC: no acute changes. He has pain at night due to sleeping fitfully - he has a history of sleep walking. Tolerating diet well. Voiding. Last BM PTA   Objective: Vital signs in last 24 hours: Temp:  [98.9 F (37.2 C)-99.1 F (37.3 C)] 99.1 F (37.3 C) (07/22 0405) Pulse Rate:  [89-100] 92 (07/22 0405) Resp:  [9-14] 9 (07/22 0405) BP: (118-152)/(60-75) 118/60 (07/22 0405) SpO2:  [96 %-100 %] 96 % (07/22 0405) Last BM Date: 12/25/20  Intake/Output from previous day: 07/21 0701 - 07/22 0700 In: 1810 [P.O.:1700; I.V.:110] Out: 550 [Urine:550] Intake/Output this shift: No intake/output data recorded.  PE: General: pleasant, WD, male who is sitting up in bed in NAD HEENT: head is normocephalic, atraumatic.  Mouth is pink and moist Heart: regular, rate, and rhythm. Palpable radial pulses bilaterally Lungs: CTAB, no wheezes, rhonchi, or rales noted.  Respiratory effort nonlabored Abd: soft, NT, ND MS: right upper extremity with expected swelling, dressing c/d/I, fingers with sensation and mobility intact. RLE with knee immobilizer in place, toes with sensation intact and WWP, ace wrap c/d/i Skin: warm and dry with no masses, lesions, or rashes Psych: A&Ox3 with an appropriate affect.    Lab Results:  Recent Labs    12/30/20 0336 12/31/20 0643  WBC 6.3 7.9  HGB 8.6* 8.5*  HCT 25.1* 25.2*  PLT 305 425*    BMET Recent Labs    12/30/20 0336  NA 138  K 3.9  CL 105  CO2 29  GLUCOSE 110*  BUN 8  CREATININE 0.55*  CALCIUM 8.0*    PT/INR No results for input(s): LABPROT, INR in the last 72 hours. CMP     Component Value Date/Time   NA 138 12/30/2020 0336   K 3.9 12/30/2020 0336   CL 105 12/30/2020 0336   CO2 29 12/30/2020 0336   GLUCOSE 110 (H) 12/30/2020 0336   BUN 8 12/30/2020 0336   CREATININE 0.55 (L) 12/30/2020 0336   CALCIUM 8.0 (L) 12/30/2020 0336   PROT 5.6 (L) 12/25/2020 0330   ALBUMIN 3.3 (L) 12/25/2020  0330   AST 33 12/25/2020 0330   ALT 64 (H) 12/25/2020 0330   ALKPHOS 83 12/25/2020 0330   BILITOT 0.5 12/25/2020 0330   GFRNONAA >60 12/30/2020 0336   Lipase  No results found for: LIPASE     Studies/Results: No results found.  Anti-infectives: Anti-infectives (From admission, onward)    Start     Dose/Rate Route Frequency Ordered Stop   12/26/20 0715  ceFAZolin (ANCEF) IVPB 2g/100 mL premix        2 g 200 mL/hr over 30 Minutes Intravenous Every 8 hours 12/26/20 0626 12/26/20 2306        Assessment/Plan  Ped vs [facility] COVID - asymptomatic.  No tx needed.  Monitor L pararenal hemorrhage - no direct injury or laceration to the kidney.  hemoglobin stable - s/p 2 units prbc. No back or abdominal pain.  UA negative for blood. 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 Urine metanephrines resulted - follow up with PCP and Dr. Gerrit Friends outpatient R humerus fx - per ortho. S/p ORIF Dr. Carola Frost 7/17. Can weight bear with platform walker but lifting restriction of 5 lbs R tib/fib fx (tibial plateau)- per ortho. S/p ORIF Dr. Carola Frost 7/17. Nonweightbearing per ortho. Hinged knee brace R navicular fx - per ortho Abrasions - local wound care Tobacco abuse - nicotine patch Intermittent history of suboxone use -  off the street, not prescribed UDS positive for amphetamines. He denies use Constipation - daily colace, daily miralax, prn dulcolax supp  FEN - regular, dec IVF VTE - lovenox ID - cefazolin periop Foley - d/c'd 7/18  Disposition - 4NP, melatonin and benadryl for sleep. PT/OT - recommending CIR which is complicated by positive COVID - waiting 10 days from positive test (7/25). He is still waiting for his cell phone to start contacting friends for possible support    LOS: 7 days    Eric Form, [facility] 01/01/2021, 8:45 AM Please see Amion for pager number during day hours 7:00am-4:30pm

## 2021-01-02 LAB — CBC
HCT: 24.5 % — ABNORMAL LOW (ref 39.0–52.0)
Hemoglobin: 7.8 g/dL — ABNORMAL LOW (ref 13.0–17.0)
MCH: 29.7 pg (ref 26.0–34.0)
MCHC: 31.8 g/dL (ref 30.0–36.0)
MCV: 93.2 fL (ref 80.0–100.0)
Platelets: 652 10*3/uL — ABNORMAL HIGH (ref 150–400)
RBC: 2.63 MIL/uL — ABNORMAL LOW (ref 4.22–5.81)
RDW: 13 % (ref 11.5–15.5)
WBC: 8 10*3/uL (ref 4.0–10.5)
nRBC: 0 % (ref 0.0–0.2)

## 2021-01-02 NOTE — Progress Notes (Signed)
Patient's mother brought patients belongings to him yesterday.  Today pt spoke with his brother on the phone about his situation.  RN was in the room, brother asked to speak directly to me.  Brother was interested in helping and I explained the CIR requirements and the 24 hour support person he would need to be eligible for the program.  Brother said he is willing and RN asked him to call back during the daytime hours and ask to speak with case manager.
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
## 2021-01-03 NOTE — Progress Notes (Signed)
Orthopedic Tech Progress Note Patient Details:  William Simon Feb 04, 1986 [id number]  Ortho Devices Type of Ortho Device: CAM walker Ortho Device/Splint Location: RLE Ortho Device/Splint Interventions: Ordered, Application, Adjustment   Post Interventions Patient Tolerated: Well Instructions Provided: Care of device  Donald Pore 01/03/2021, 1:26 PM

## 2021-01-03 NOTE — Progress Notes (Signed)
Pt complaining of severe right calf pain and numbness, this is new for him, states his leg has not felt this way before. Paged Orthopaedic PA Alfonse Alpers who is on call for the ortho service that originally saw patient. PA was on the unit already so she is currently at bedside assessing patient. Gave patient PRN morphine IVP per orders for pain.
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
## 2021-01-04 ENCOUNTER — Other Ambulatory Visit (HOSPITAL_COMMUNITY): Payer: Self-pay

## 2021-01-04 DIAGNOSIS — U071 COVID-19: Secondary | ICD-10-CM | POA: Diagnosis present

## 2021-01-04 DIAGNOSIS — E278 Other specified disorders of adrenal gland: Secondary | ICD-10-CM | POA: Diagnosis present

## 2021-01-04 DIAGNOSIS — T1490XA Injury, unspecified, initial encounter: Secondary | ICD-10-CM | POA: Diagnosis present

## 2021-01-04 MED ORDER — METHOCARBAMOL 750 MG PO TABS
750.0000 mg | ORAL_TABLET | Freq: Three times a day (TID) | ORAL | 0 refills | Status: AC | PRN
  Filled 2021-01-04: qty 15, 5d supply, fill #0

## 2021-01-04 MED ORDER — BACITRACIN ZINC 500 UNIT/GM EX OINT: TOPICAL_OINTMENT | Freq: Two times a day (BID) | CUTANEOUS | 0 refills | Status: AC

## 2021-01-04 MED ORDER — OXYCODONE HCL 5 MG PO TABS
5.0000 mg | ORAL_TABLET | Freq: Four times a day (QID) | ORAL | 0 refills | Status: AC | PRN
  Filled 2021-01-04: qty 28, 7d supply, fill #0

## 2021-01-04 MED ORDER — ACETAMINOPHEN 500 MG PO TABS: 1000.0000 mg | ORAL_TABLET | Freq: Four times a day (QID) | ORAL | 0 refills | Status: AC

## 2021-01-04 MED ORDER — POLYETHYLENE GLYCOL 3350 17 G PO PACK: 17.0000 g | PACK | Freq: Every day | ORAL | 0 refills | Status: AC | PRN

## 2021-01-04 MED ORDER — SENNOSIDES-DOCUSATE SODIUM 8.6-50 MG PO TABS: 1.0000 | ORAL_TABLET | Freq: Every evening | ORAL | Status: AC | PRN

## 2021-01-04 NOTE — TOC Transition Note (Addendum)
Transition of Care [facility]) - CM/SW Discharge Note   Patient Details  Name: William Simon MRN: [id number] Date of Birth: 06/03/1986  [facility] CM/SW Contact:  Glennon Mac, RN Phone Number: 01/04/2021, 12:15pm  Clinical Narrative:   Patient medically stable for discharge home today with his mother temporarily; planning eventual transition to his brother's home in [place].  PT/OT now recommending outpatient therapy follow-up; referrals made to [facility].  Referral to [employer] for recommended DME; right platform rolling walker, wheelchair, and 3 in 1 bedside commode to be delivered to bedside prior to discharge.  Patient has no PCP; discussed follow-up appointment with primary care at [facility] clinic.  He states he is interested in the information, but does not want an appointment made at this time.  Clinic information placed on AVS for patient reference. Pt declines need for financial assistance with dc medications.      Final next level of care: OP Rehab Barriers to Discharge: Barriers Resolved   Patient Goals and CMS Choice Patient states their goals for this hospitalization and ongoing recovery are:: to go home                            Discharge Plan and Services   Discharge Planning Services: [facility]            DME Arranged: 3-N-1, Dan Humphreys platform, Wheelchair manual DME Agency: [employer] Date DME Agency Contacted: 01/04/21 Time DME Agency Contacted: 1209 Representative spoke with at DME Agency: Velna Hatchet            Social Determinants of Health (SDOH) Interventions     Readmission Risk Interventions Readmission Risk Prevention Plan 01/04/2021  Post Dischage Appt Not Complete  Appt Comments pt declines appointment  Medication Screening Complete  Transportation Screening Complete   Quintella Baton, RN, BSN  Trauma/Neuro ICU Case Manager [phone]

## 2021-01-04 NOTE — Care Management (Signed)
    Durable Medical Equipment  (From admission, onward)           Start     Ordered   01/04/21 1155  For home use only DME standard manual wheelchair with seat cushion  Once       Comments: Patient suffers from closed fracture of right proximal tibia which impairs their ability to perform daily activities like grooming and toileting in the home.  A walker will not resolve issue with performing activities of daily living. A wheelchair will allow patient to safely perform daily activities. Patient can safely propel the wheelchair in the home or has a caregiver who can provide assistance. Length of need 6 months . Accessories: elevating leg rests (ELRs), wheel locks, extensions and anti-tippers.   01/04/21 1156   01/04/21 1154  For home use only DME 3 n 1  Once        01/04/21 1156   01/04/21 1154  For home use only DME Walker platform  Once       Comments: Right platform walker  Question:  Patient needs a walker to treat with the following condition  Answer:  Closed fracture of right proximal tibia   01/04/21 1156            Quintella Baton, RN, BSN  Trauma/Neuro ICU Case Manager [phone]
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
## 2021-01-09 ENCOUNTER — Other Ambulatory Visit: Payer: Self-pay

## 2021-01-09 ENCOUNTER — Emergency Department (HOSPITAL_COMMUNITY): Payer: Self-pay

## 2021-01-09 ENCOUNTER — Emergency Department (HOSPITAL_COMMUNITY)
Admission: EM | Admit: 2021-01-09 | Discharge: 2021-01-09 | Disposition: A | Discharge status: Home / Self Care | Payer: Self-pay | Attending: Emergency Medicine | Admitting: Emergency Medicine

## 2021-01-09 DIAGNOSIS — S8991XA Unspecified injury of right lower leg, initial encounter: Secondary | ICD-10-CM | POA: Insufficient documentation

## 2021-01-09 DIAGNOSIS — Z8616 Personal history of COVID-19: Secondary | ICD-10-CM | POA: Insufficient documentation

## 2021-01-09 DIAGNOSIS — F17210 Nicotine dependence, cigarettes, uncomplicated: Secondary | ICD-10-CM | POA: Insufficient documentation

## 2021-01-09 DIAGNOSIS — Y92009 Unspecified place in unspecified non-institutional (private) residence as the place of occurrence of the external cause: Secondary | ICD-10-CM | POA: Insufficient documentation

## 2021-01-09 DIAGNOSIS — W010XXA Fall on same level from slipping, tripping and stumbling without subsequent striking against object, initial encounter: Secondary | ICD-10-CM | POA: Insufficient documentation

## 2021-01-09 MED ORDER — HYDROMORPHONE HCL 1 MG/ML IJ SOLN
1.0000 mg | Freq: Once | INTRAMUSCULAR | Status: AC
  Administered 2021-01-09: 1 mg via INTRAVENOUS
  Filled 2021-01-09: qty 1

## 2021-01-09 NOTE — ED Notes (Signed)
Dressing changed and instructions about change dressing provided

## 2021-01-09 NOTE — Discharge Instructions (Addendum)
    Myrene Galas, MD. Schedule an appointment as soon as possible for a visit in 2 week(s).   Specialty: Orthopedic Surgery Contact information: [address] [place] [address] [phone]
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
## 2021-01-09 NOTE — ED Notes (Signed)
Left voicemail for CSW to discuss living situation with pt.

## 2021-01-09 NOTE — ED Notes (Signed)
Provided pt w/ bag lunch.
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
## 2021-02-25 ENCOUNTER — Other Ambulatory Visit (HOSPITAL_COMMUNITY): Payer: Self-pay | Admitting: Surgery

## 2021-02-25 ENCOUNTER — Other Ambulatory Visit: Payer: Self-pay | Admitting: Surgery

## 2021-02-25 DIAGNOSIS — E278 Other specified disorders of adrenal gland: Secondary | ICD-10-CM

## 2021-03-06 ENCOUNTER — Ambulatory Visit (HOSPITAL_COMMUNITY): Admission: RE | Admit: 2021-03-06 | Payer: Self-pay | Source: Ambulatory Visit

## 2021-03-06 ENCOUNTER — Encounter (HOSPITAL_COMMUNITY): Payer: Self-pay

## 2021-03-08 ENCOUNTER — Encounter (HOSPITAL_COMMUNITY): Payer: Self-pay

## 2021-03-09 ENCOUNTER — Encounter (HOSPITAL_COMMUNITY): Payer: Self-pay

## 2021-09-19 ENCOUNTER — Emergency Department (HOSPITAL_COMMUNITY)
Admission: EM | Admit: 2021-09-19 | Discharge: 2021-09-19 | Disposition: A | Discharge status: Home / Self Care | Payer: Medicaid Other | Attending: Emergency Medicine | Admitting: Emergency Medicine

## 2021-09-19 ENCOUNTER — Encounter (HOSPITAL_COMMUNITY): Payer: Self-pay | Admitting: Emergency Medicine

## 2021-09-19 ENCOUNTER — Other Ambulatory Visit: Payer: Self-pay

## 2021-09-19 DIAGNOSIS — R000 Tachycardia, unspecified: Secondary | ICD-10-CM | POA: Insufficient documentation

## 2021-09-19 DIAGNOSIS — R443 Hallucinations, unspecified: Secondary | ICD-10-CM

## 2021-09-19 DIAGNOSIS — F424 Excoriation (skin-picking) disorder: Secondary | ICD-10-CM | POA: Insufficient documentation

## 2021-09-19 DIAGNOSIS — F1510 Other stimulant abuse, uncomplicated: Secondary | ICD-10-CM

## 2021-09-19 DIAGNOSIS — F419 Anxiety disorder, unspecified: Secondary | ICD-10-CM | POA: Insufficient documentation

## 2021-09-19 DIAGNOSIS — F1590 Other stimulant use, unspecified, uncomplicated: Secondary | ICD-10-CM | POA: Insufficient documentation

## 2021-09-19 DIAGNOSIS — R441 Visual hallucinations: Secondary | ICD-10-CM | POA: Insufficient documentation

## 2021-09-19 LAB — RAPID URINE DRUG SCREEN, HOSP PERFORMED
Amphetamines: POSITIVE — AB
Barbiturates: NOT DETECTED
Benzodiazepines: NOT DETECTED
Cocaine: NOT DETECTED
Opiates: NOT DETECTED
Tetrahydrocannabinol: NOT DETECTED

## 2021-09-19 MED ORDER — HYDROXYZINE HCL 25 MG PO TABS
25.0000 mg | ORAL_TABLET | Freq: Once | ORAL | Status: AC
  Administered 2021-09-19: 25 mg via ORAL
  Filled 2021-09-19: qty 1

## 2021-09-19 MED ORDER — HYDROXYZINE HCL 25 MG PO TABS: 25.0000 mg | ORAL_TABLET | Freq: Four times a day (QID) | ORAL | 0 refills | Status: AC | PRN

## 2021-09-19 NOTE — Discharge Instructions (Signed)
Your symptoms are likely due to methamphetamine use.  Please avoid using this.  It can cause you to have hallucinations and feel as though bugs are crawling over you.  I do not see any signs of bugs or parasites on your exam today.  You can use hydroxyzine to help with anxiety and itching every 6 hours as needed.  The symptoms should improve as meth wears off. ?

## 2021-09-19 NOTE — ED Triage Notes (Signed)
Pt c/o parasite crawling in his skin after doing a line of meth tonight.  ?

## 2021-09-19 NOTE — ED Provider Notes (Signed)
?[facility] EMERGENCY DEPARTMENT ?Provider Note ? ? ?CSN: [id number] ?Arrival date & time: 09/19/21  2023 ? ?  ? ?History ? ?Chief Complaint  ?Patient presents with  ? Hallucinations  ? ? ?William Simon is a 36 y.o. male. ? ?William Simon is a 36 y.o. male with a history of polysubstance abuse, presents to the emergency department reporting that he feels like he has bugs crawling all over him and crawling under his skin after doing.  He reports he thought he was smoking weed with a friend but after smoking it his friend told him that it was actually methamphetamine.  He reports after smoking this he began feeling like he had little black bugs crawling all over him and crawling under his skin.  He reports he thinks he saw bugs but reports they really just started to look like pimples where he had recently shaved.  He reports he has felt very itchy and has been scratching.  He has not had any other sensations or hallucinations.  He denies any homicidal or suicidal ideations.  No chest pain, shortness of breath or other complaints.  Patient reports he was not having any itching or seeing any bugs prior to using meth.  He reports he has never smoked meth before but has snorted it and not had similar symptoms. ? ?The history is provided by the patient.  ? ?  ? ?Home Medications ?Prior to Admission medications   ?Medication Sig Start Date End Date Taking? Authorizing Provider  ?hydrOXYzine (ATARAX) 25 MG tablet Take 1 tablet (25 mg total) by mouth every 6 (six) hours as needed for anxiety or itching. 09/19/21  Yes Dartha Lodge, PA-C  ?acetaminophen (TYLENOL) 500 MG tablet Take 1 tablet (500 mg total) by mouth every 6 (six) hours as needed. 01/10/17   Emi Holes, PA-C  ?albuterol (PROVENTIL HFA;VENTOLIN HFA) 108 (90 Base) MCG/ACT inhaler Inhale 1-2 puffs into the lungs every 6 (six) hours as needed for wheezing or shortness of breath. 06/11/18   Dahlia Byes A, NP  ?bacitracin ointment Apply topically 2  (two) times daily. 01/04/21   Eric Form, PA-C  ?ibuprofen (ADVIL,MOTRIN) 600 MG tablet Take 1 tablet (600 mg total) by mouth every 6 (six) hours as needed. 01/10/17   Emi Holes, PA-C  ?oseltamivir (TAMIFLU) 75 MG capsule Take 1 capsule (75 mg total) by mouth every 12 (twelve) hours. 06/11/18   Dahlia Byes A, NP  ?polyethylene glycol (MIRALAX / GLYCOLAX) 17 g packet Take 17 g by mouth daily as needed for mild constipation or moderate constipation. 01/04/21   Eric Form, PA-C  ?senna-docusate (SENOKOT-S) 8.6-50 MG tablet Take 1 tablet by mouth at bedtime as needed for mild constipation or moderate constipation. 01/04/21   Eric Form, PA-C  ?   ? ?Allergies    ?Patient has no known allergies.   ? ?Review of Systems   ?Review of Systems  ?Constitutional:  Negative for chills and fever.  ?HENT: Negative.    ?Respiratory:  Negative for cough and shortness of breath.   ?Cardiovascular:  Negative for chest pain.  ?Gastrointestinal:  Negative for abdominal pain, nausea and vomiting.  ?Genitourinary:  Negative for frequency.  ?Musculoskeletal:  Negative for back pain and myalgias.  ?Skin:  Negative for color change and rash.  ?Neurological:  Negative for dizziness, syncope and light-headedness.  ?Psychiatric/Behavioral:  Positive for hallucinations. Negative for agitation and suicidal ideas. The patient is nervous/anxious.   ? ?Physical  Exam ?Updated Vital Signs ?BP (!) 136/102 (BP Location: Right Arm)   Pulse (!) 130   Temp 97.7 ?F (36.5 ?C) (Oral)   Resp (!) 21   Ht 5\' 4"  (1.626 m)   Wt 49.9 kg   SpO2 98%   BMI 18.88 kg/m?  ?Physical Exam ?Vitals and nursing note reviewed.  ?Constitutional:   ?   General: He is not in acute distress. ?   Appearance: Normal appearance. He is well-developed. He is not ill-appearing or diaphoretic.  ?   Comments: Patient is alert and appears anxious but is in no acute distress.  ?HENT:  ?   Head: Normocephalic and atraumatic.  ?   Mouth/Throat:  ?   Mouth:  Mucous membranes are moist.  ?   Pharynx: Oropharynx is clear.  ?Eyes:  ?   General:     ?   Right eye: No discharge.     ?   Left eye: No discharge.  ?Cardiovascular:  ?   Rate and Rhythm: Regular rhythm. Tachycardia present.  ?Pulmonary:  ?   Effort: Pulmonary effort is normal. No respiratory distress.  ?   Breath sounds: Normal breath sounds.  ?Chest:  ?   Chest wall: No tenderness.  ?Abdominal:  ?   General: Abdomen is flat. There is no distension.  ?   Palpations: Abdomen is soft.  ?   Tenderness: There is no abdominal tenderness. There is no guarding.  ?Musculoskeletal:     ?   General: No deformity.  ?Skin: ?   General: Skin is warm and dry.  ?   Comments: There are some red excoriations over the trunk and extremities but no rash noted, no vesicles, pustules or petechiae, and no insects or burrow marks noted.  ?Neurological:  ?   Mental Status: He is alert and oriented to person, place, and time.  ?   Coordination: Coordination normal.  ?Psychiatric:     ?   Attention and Perception: He perceives visual hallucinations. He does not perceive auditory hallucinations.     ?   Mood and Affect: Mood is anxious.     ?   Speech: Speech normal.     ?   Behavior: Behavior normal.     ?   Thought Content: Thought content does not include homicidal or suicidal ideation.  ? ? ?ED Results / Procedures / Treatments   ?Labs ?(all labs ordered are listed, but only abnormal results are displayed) ?Labs Reviewed  ?RAPID URINE DRUG SCREEN, HOSP PERFORMED - Abnormal; Notable for the following components:  ?    Result Value  ? Amphetamines POSITIVE (*)   ? All other components within normal limits  ? ? ?EKG ?None ? ?Radiology ?No results found. ? ?Procedures ?Procedures  ? ? ?Medications Ordered in ED ?Medications  ?hydrOXYzine (ATARAX) tablet 25 mg (25 mg Oral Given 09/19/21 2122)  ? ? ?ED Course/ Medical Decision Making/ A&P ?  ?                        ?Medical Decision Making ?Problems Addressed: ?Hallucinations: complicated  acute illness or injury ?Methamphetamine use (HCC): acute illness or injury with systemic symptoms ? ?Amount and/or Complexity of Data Reviewed ?External Data Reviewed: labs and notes. ?Labs: ordered. ? ?Risk ?Prescription drug management. ? ? ?36 year old male presents with sensation of bugs crawling all over him after using methamphetamine.  He has no other hallucinations or delusions and does not have any suicidal  or homicidal thoughts, does appear quite anxious and on arrival is tachycardic.  He has no other medical complaints.  No evidence of insects, rash or skin changes noted aside from some excoriations and patient noted to be scratching on exam. ? ?I discussed with the patient that this sensation may be related to his methamphetamine use, and that I do not see evidence of insects or parasite on exam, and there is no rash or other skin changes noted.  Patient reports he has not previously experienced this but after this information patient was much more reassured and tachycardia quickly improved.  He was given hydroxyzine to help with anxiety and itching and after receiving this medication he is feeling much better.  ? ?UDS is positive for methamphetamine as expected, otherwise negative.  No other complaints do not feel that further laboratory work-up or imaging is indicated at this time. ? ?Social determinants of health: Substance abuse ? ?Disposition: At this time patient is appropriate for discharge home, provided prescription for hydroxyzine and discussed that the symptoms should improve as effects of methamphetamine wear off and encouraged him to avoid using this drug in the future as it may cause similar symptoms again or worsening symptoms.  At this time patient does not require hospital admission or further emergent evaluation, discharged home, return precautions provided. ? ? ? ? ? ? ? ?Final Clinical Impression(s) / ED Diagnoses ?Final diagnoses:  ?Methamphetamine use (HCC)  ?Hallucinations   ? ? ?Rx / DC Orders ?ED Discharge Orders   ? ?      Ordered  ?  hydrOXYzine (ATARAX) 25 MG tablet  Every 6 hours PRN       ? 09/19/21 2257  ? ?  ?  ? ?  ? ? ?  ?Dartha LodgeFord, Shir Bergman N, PA-C ?09/19/21 2314 ? ?  ?Jerilee FieldButler, Michael C, M

## 2022-04-01 ENCOUNTER — Encounter (HOSPITAL_COMMUNITY): Payer: Self-pay | Admitting: Emergency Medicine

## 2022-04-01 ENCOUNTER — Other Ambulatory Visit: Payer: Self-pay

## 2022-04-01 ENCOUNTER — Emergency Department (HOSPITAL_COMMUNITY)
Admission: EM | Admit: 2022-04-01 | Discharge: 2022-04-01 | Disposition: A | Discharge status: Home / Self Care | Payer: Medicaid Other | Attending: Emergency Medicine | Admitting: Emergency Medicine

## 2022-04-01 DIAGNOSIS — Z7721 Contact with and (suspected) exposure to potentially hazardous body fluids: Secondary | ICD-10-CM | POA: Insufficient documentation

## 2022-04-01 DIAGNOSIS — R000 Tachycardia, unspecified: Secondary | ICD-10-CM | POA: Insufficient documentation

## 2022-04-01 LAB — RAPID HIV SCREEN (HIV 1/2 AB+AG)
HIV 1/2 Antibodies: NONREACTIVE
HIV-1 P24 Antigen - HIV24: NONREACTIVE

## 2022-04-01 LAB — HEPATITIS B SURFACE ANTIGEN: Hepatitis B Surface Ag: NONREACTIVE

## 2022-04-01 NOTE — ED Provider Notes (Signed)
[facility] EMERGENCY DEPARTMENT Provider Note   CSN: [id number] Arrival date & time: 04/01/22  1958     History  Chief Complaint  Patient presents with   Labs Only    William Simon is a 36 y.o. male.  Patient is a 36 year old male who is being brought in today by the [facility] department needing and exposure panel done.  Patient was in a car where there were used needles and when the officer was getting the patient out of the car he was stuck by a needle.  Patient reports that he does not use needles or use heroin and he was not the one using the needles it was a other person in the car.  However he is agreeable to have the blood drawn so that everybody is safe and they can confirm he does not have anything.  He has no complaints and denies having any sticks with any needles.  The history is provided by the patient and the police.       Home Medications Prior to Admission medications   Medication Sig Start Date End Date Taking? Authorizing Provider  acetaminophen (TYLENOL) 500 MG tablet Take 1 tablet (500 mg total) by mouth every 6 (six) hours as needed. 01/10/17   Law, Bea Graff, PA-C  albuterol (PROVENTIL HFA;VENTOLIN HFA) 108 (90 Base) MCG/ACT inhaler Inhale 1-2 puffs into the lungs every 6 (six) hours as needed for wheezing or shortness of breath. 06/11/18   Loura Halt A, NP  bacitracin ointment Apply topically 2 (two) times daily. 01/04/21   Winferd Humphrey, PA-C  hydrOXYzine (ATARAX) 25 MG tablet Take 1 tablet (25 mg total) by mouth every 6 (six) hours as needed for anxiety or itching. 09/19/21   Jacqlyn Larsen, PA-C  ibuprofen (ADVIL,MOTRIN) 600 MG tablet Take 1 tablet (600 mg total) by mouth every 6 (six) hours as needed. 01/10/17   Law, Bea Graff, PA-C  oseltamivir (TAMIFLU) 75 MG capsule Take 1 capsule (75 mg total) by mouth every 12 (twelve) hours. 06/11/18   Loura Halt A, NP  polyethylene glycol (MIRALAX / GLYCOLAX) 17 g packet Take 17 g by mouth  daily as needed for mild constipation or moderate constipation. 01/04/21   Winferd Humphrey, PA-C  senna-docusate (SENOKOT-S) 8.6-50 MG tablet Take 1 tablet by mouth at bedtime as needed for mild constipation or moderate constipation. 01/04/21   Winferd Humphrey, PA-C      Allergies    Patient has no known allergies.    Review of Systems   Review of Systems  Physical Exam Updated Vital Signs BP (!) 147/107 (BP Location: Right Arm)   Pulse (!) 110   Temp 98.4 F (36.9 C) (Oral)   Resp 20   SpO2 100%  Physical Exam Vitals and nursing note reviewed.  HENT:     Head: Normocephalic and atraumatic.  Cardiovascular:     Rate and Rhythm: Tachycardia present.  Pulmonary:     Effort: Pulmonary effort is normal.  Musculoskeletal:     Right lower leg: No edema.     Left lower leg: No edema.     Comments: No evidence of track marks  Neurological:     Mental Status: He is alert.  Psychiatric:        Mood and Affect: Mood normal.     ED Results / Procedures / Treatments   Labs (all labs ordered are listed, but only abnormal results are displayed) Labs Reviewed  RAPID HIV SCREEN (  HIV 1/2 AB+AG)  HEPATITIS B SURFACE ANTIGEN  HCV AB W REFLEX TO QUANT PCR    EKG None  Radiology No results found.  Procedures Procedures    Medications Ordered in ED Medications - No data to display  ED Course/ Medical Decision Making/ A&P                           Medical Decision Making  Patient here for exposure panel to be done.  A police officer who came in contact with the patient tonight was stuck by a needle that was in the car the patient was in.  Patient reports they were used needles but he did not use them.  It is the police departments protocol that everybody involved have blood drawn.  Patient is agreeable to have his blood drawn for this purpose.  Exposure panel was sent.  Patient has no other need to be in the emergency room.  He is otherwise medically stable.  Patient was  discharged into police custody.        Final Clinical Impression(s) / ED Diagnoses Final diagnoses:  Exposure to bloodborne pathogen    Rx / DC Orders ED Discharge Orders     None         Gwyneth Sprout, MD 04/01/22 2026

## 2022-04-01 NOTE — ED Triage Notes (Signed)
Patient here in custody of [facility] department.  Patient needing blood draw for sheriff exposure to heroin needle.

## 2022-04-01 NOTE — Discharge Instructions (Signed)
The results of the tests will result in 24-36 hours.

## 2022-04-03 LAB — HCV INTERPRETATION

## 2022-04-03 LAB — HCV AB W REFLEX TO QUANT PCR: HCV Ab: NONREACTIVE

## 2022-06-15 ENCOUNTER — Telehealth: Payer: Self-pay

## 2022-06-15 NOTE — Telephone Encounter (Signed)
Mychart msg sent. AS, CMA 

## 2022-08-27 IMAGING — RF DG HUMERUS 2V *R*
1 series · 5 of 5 positions shown · non-contrast
Comparison: 12/25/2020

CLINICAL DATA: Right humerus ORIF

EXAM:
RIGHT HUMERUS - 2+ VIEW

[Series 1: run · 5 of 5 slices shown]
[im 1/5]
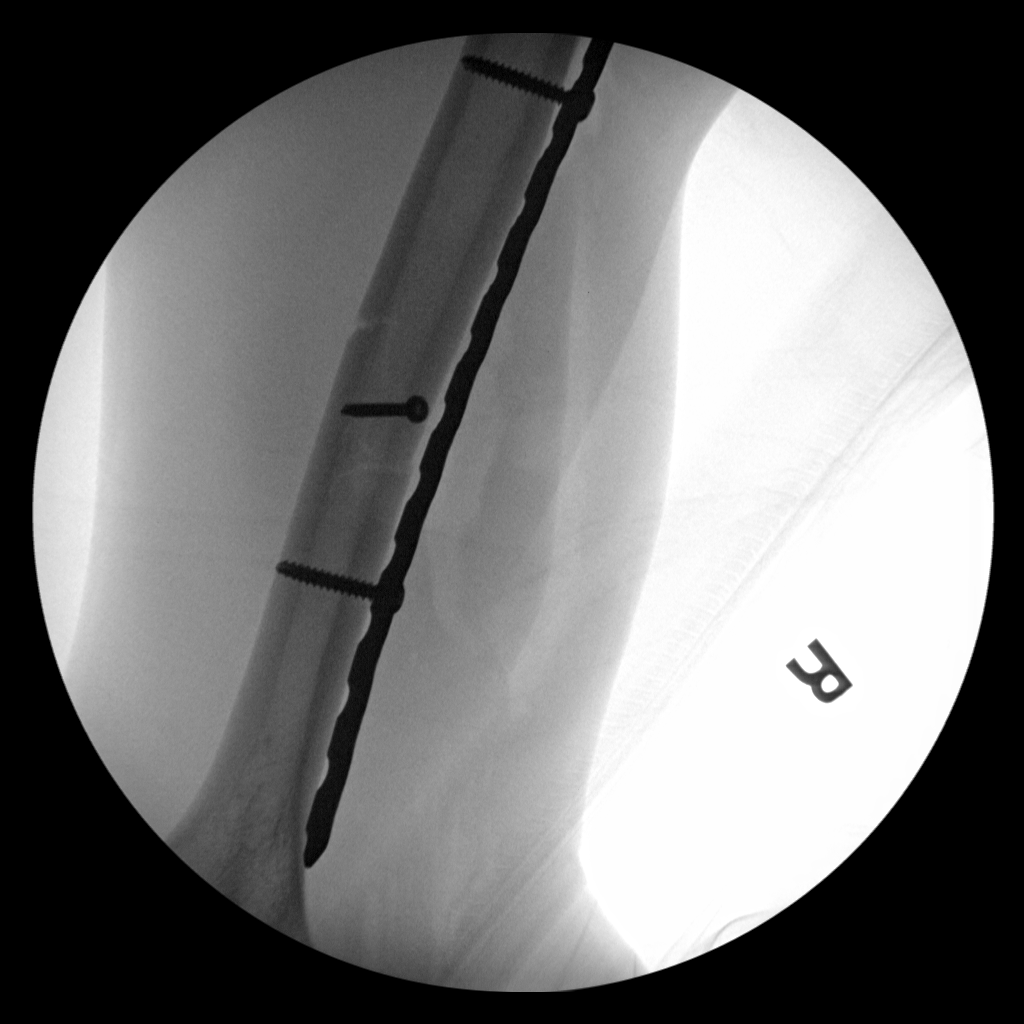
[im 2/5]
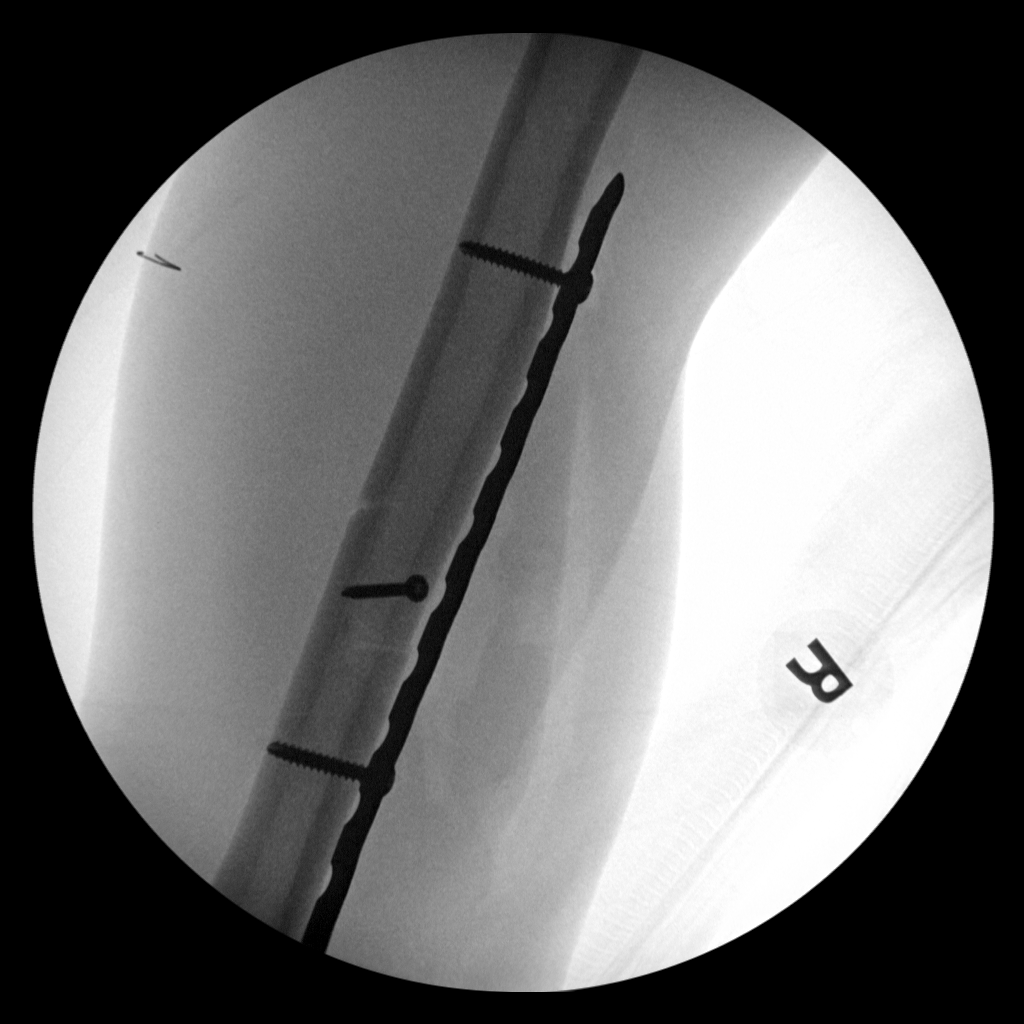
[im 3/5]
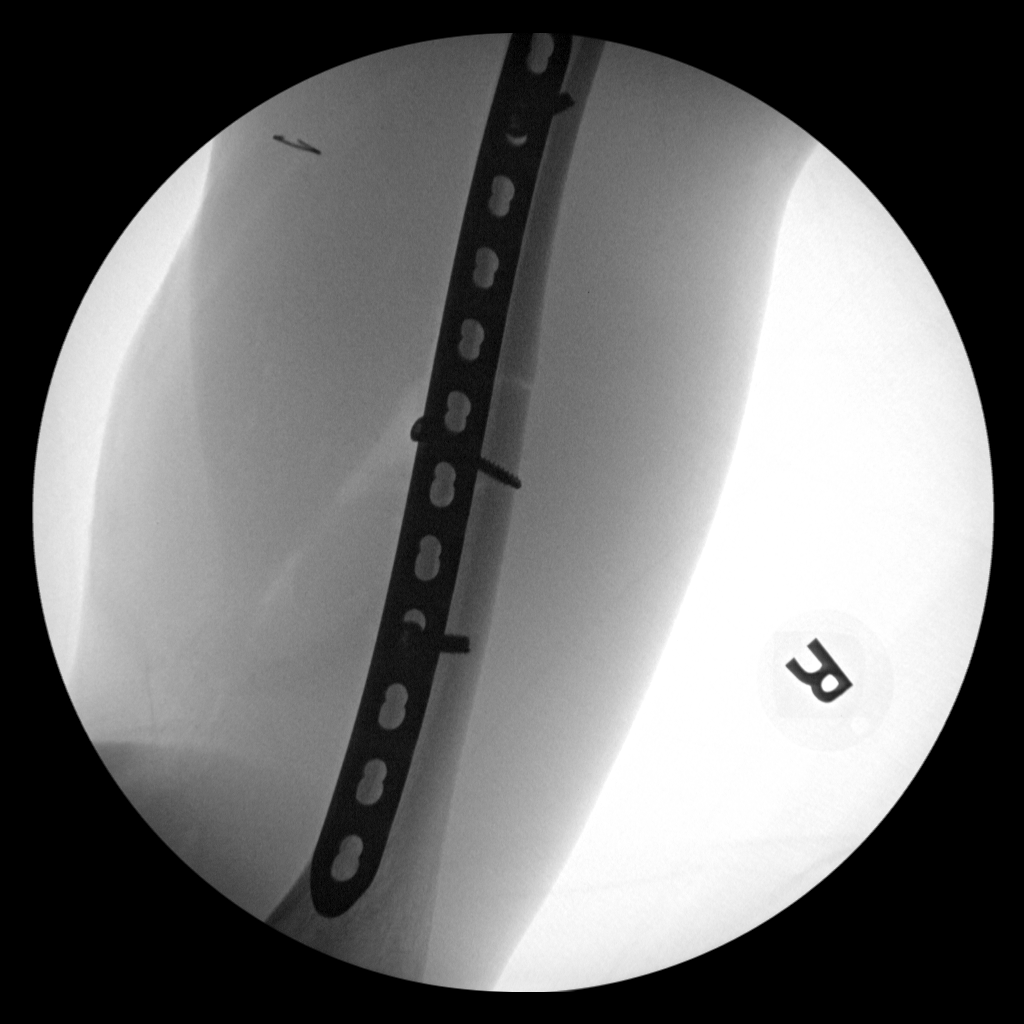
[im 4/5]
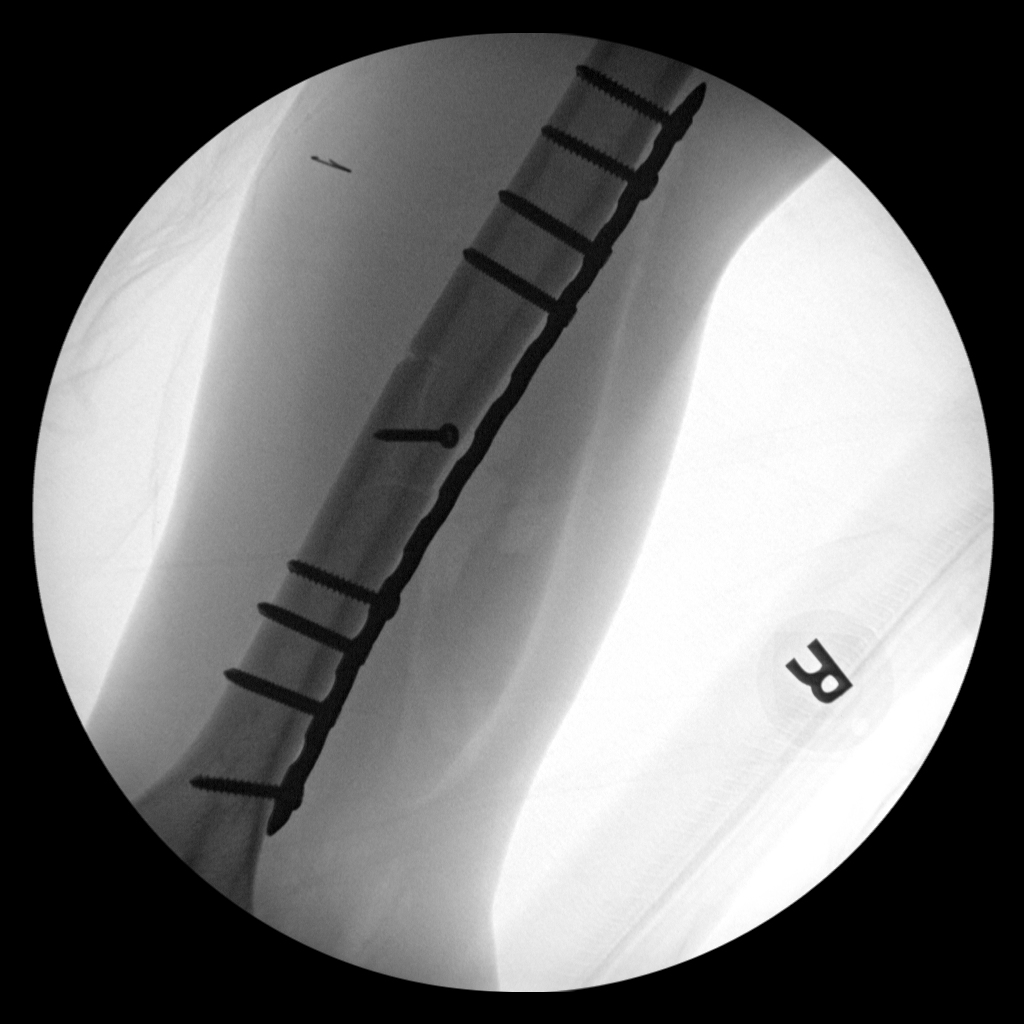
[im 5/5]
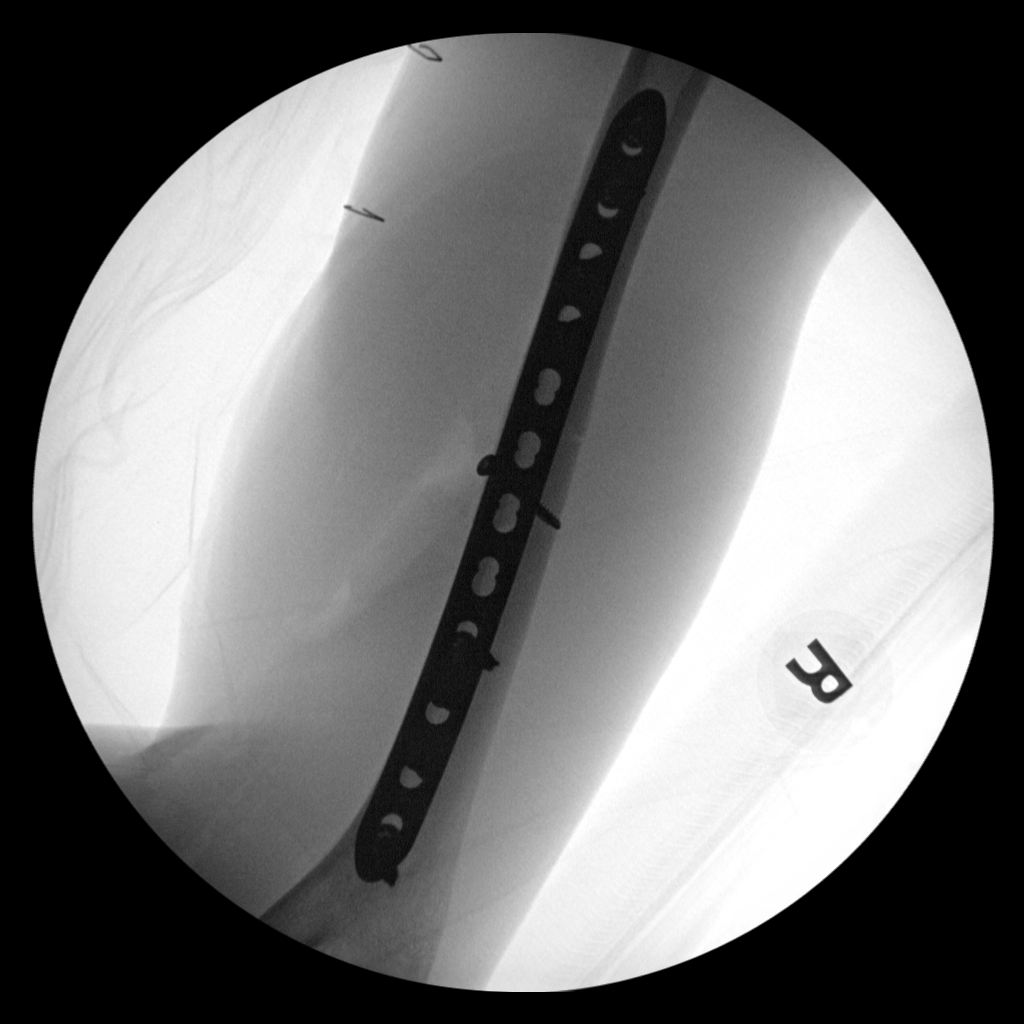

[5 of 5 positions shown; findings below may reference images not displayed]

FINDINGS: Five fluoroscopic images are obtained during the performance of
procedure and are provided for interpretation only. Images
demonstrate plate and screw fixation traversing the distal humeral
fracture, with anatomic alignment. Please refer to the operative
report for full description of findings.
IMPRESSION: 1. ORIF right humerus with anatomic alignment.

## 2022-08-28 IMAGING — DX DG TIBIA/FIBULA PORT 2V*R*
4 series · 4 of 4 positions shown · non-contrast
Comparison: December 25, 2020

CLINICAL DATA: Status post open reduction internal fixation.

EXAM:
PORTABLE RIGHT TIBIA AND FIBULA - 2 VIEW

[tibia ap (1 of 2)]
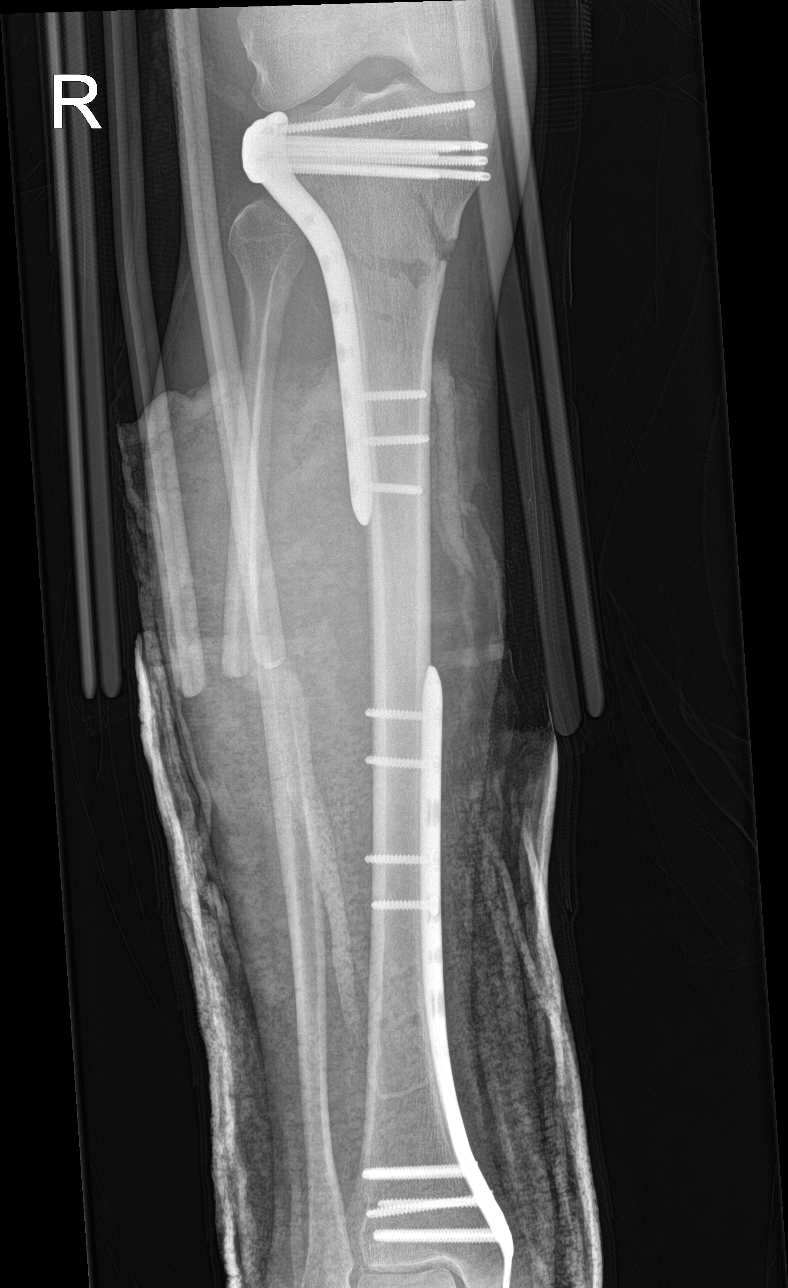

[tibia ap (2 of 2)]
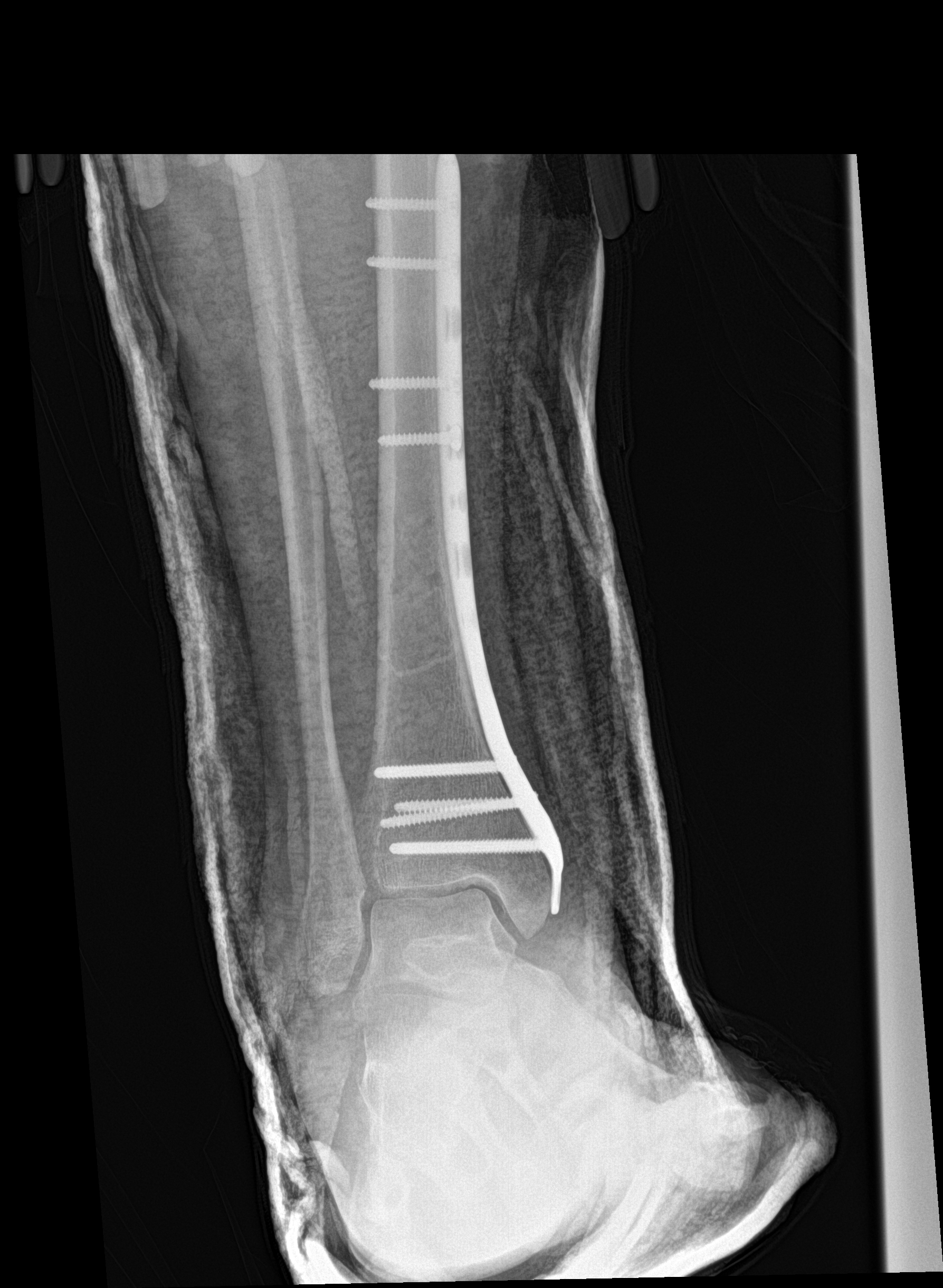

[tibia lat (1 of 2)]
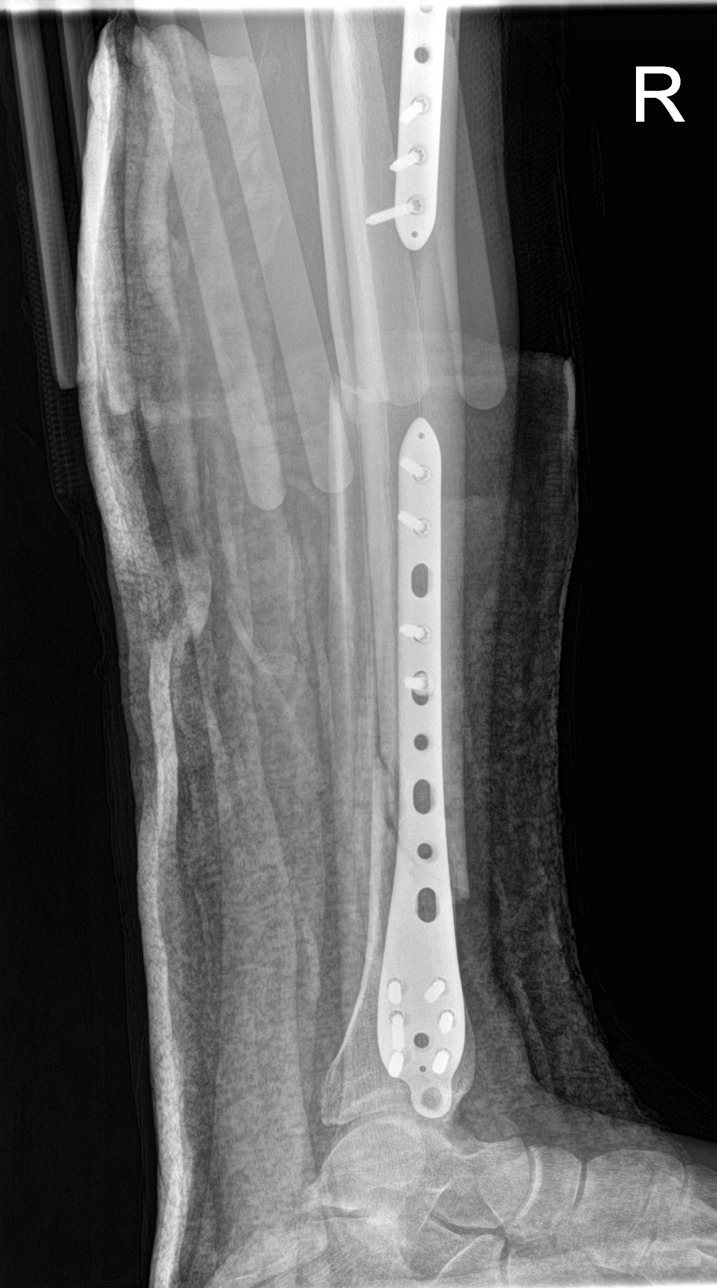

[tibia lat (2 of 2)]
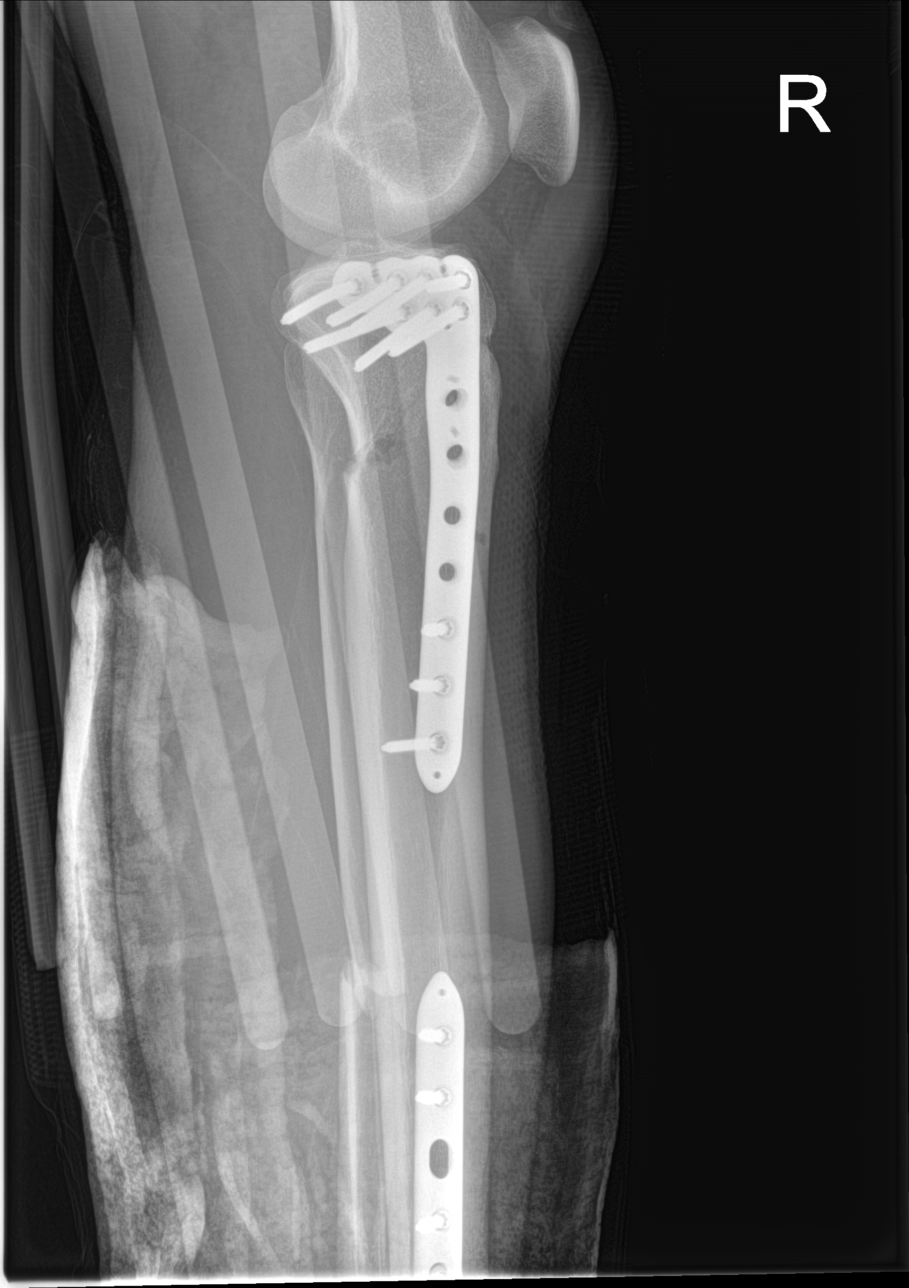

[4 of 4 positions shown; findings below may reference images not displayed]

FINDINGS: Post open reduction internal fixation severe displaced proximal and
distal right tibial fracture. Normal alignment of the orthopedic
hardware. No evidence of immediate complications. Improved
alignment.
IMPRESSION: Post open reduction internal fixation of severely displaced proximal
and distal right tibial fracture.
